# Patient Record
Sex: Female | Born: 1945 | Race: Black or African American | Hispanic: No | Smoking: Never smoker
Health system: Southern US, Community
[De-identification: ages and names within clinical notes are randomized; demographics above are authoritative.]

## PROBLEM LIST (undated history)

## (undated) ENCOUNTER — Emergency Department (HOSPITAL_BASED_OUTPATIENT_CLINIC_OR_DEPARTMENT_OTHER): Admission: EM | Payer: Commercial Managed Care - HMO | Source: Home / Self Care

## (undated) DIAGNOSIS — E119 Type 2 diabetes mellitus without complications: Secondary | ICD-10-CM

## (undated) DIAGNOSIS — I639 Cerebral infarction, unspecified: Secondary | ICD-10-CM

## (undated) DIAGNOSIS — I10 Essential (primary) hypertension: Secondary | ICD-10-CM

---

## 2015-03-02 DIAGNOSIS — H269 Unspecified cataract: Secondary | ICD-10-CM | POA: Insufficient documentation

## 2015-11-25 DIAGNOSIS — D72829 Elevated white blood cell count, unspecified: Secondary | ICD-10-CM | POA: Insufficient documentation

## 2015-11-25 DIAGNOSIS — I10 Essential (primary) hypertension: Secondary | ICD-10-CM | POA: Insufficient documentation

## 2015-11-25 DIAGNOSIS — N179 Acute kidney failure, unspecified: Secondary | ICD-10-CM | POA: Insufficient documentation

## 2015-11-25 DIAGNOSIS — R55 Syncope and collapse: Secondary | ICD-10-CM | POA: Insufficient documentation

## 2016-02-04 DIAGNOSIS — E785 Hyperlipidemia, unspecified: Secondary | ICD-10-CM | POA: Insufficient documentation

## 2016-02-04 DIAGNOSIS — H9203 Otalgia, bilateral: Secondary | ICD-10-CM | POA: Insufficient documentation

## 2016-02-04 DIAGNOSIS — J399 Disease of upper respiratory tract, unspecified: Secondary | ICD-10-CM | POA: Insufficient documentation

## 2016-02-04 DIAGNOSIS — M170 Bilateral primary osteoarthritis of knee: Secondary | ICD-10-CM | POA: Insufficient documentation

## 2016-03-22 DIAGNOSIS — R928 Other abnormal and inconclusive findings on diagnostic imaging of breast: Secondary | ICD-10-CM | POA: Insufficient documentation

## 2016-04-01 DIAGNOSIS — R590 Localized enlarged lymph nodes: Secondary | ICD-10-CM | POA: Insufficient documentation

## 2016-11-20 DIAGNOSIS — Z961 Presence of intraocular lens: Secondary | ICD-10-CM | POA: Insufficient documentation

## 2016-11-20 DIAGNOSIS — H52203 Unspecified astigmatism, bilateral: Secondary | ICD-10-CM | POA: Insufficient documentation

## 2016-11-20 DIAGNOSIS — Z794 Long term (current) use of insulin: Secondary | ICD-10-CM | POA: Insufficient documentation

## 2017-06-08 DIAGNOSIS — E871 Hypo-osmolality and hyponatremia: Secondary | ICD-10-CM | POA: Insufficient documentation

## 2018-09-29 DIAGNOSIS — R519 Headache, unspecified: Secondary | ICD-10-CM | POA: Insufficient documentation

## 2018-09-29 DIAGNOSIS — R42 Dizziness and giddiness: Secondary | ICD-10-CM | POA: Insufficient documentation

## 2020-04-26 DIAGNOSIS — E1169 Type 2 diabetes mellitus with other specified complication: Secondary | ICD-10-CM | POA: Insufficient documentation

## 2020-04-26 DIAGNOSIS — E669 Obesity, unspecified: Secondary | ICD-10-CM | POA: Insufficient documentation

## 2020-04-26 DIAGNOSIS — E876 Hypokalemia: Secondary | ICD-10-CM | POA: Insufficient documentation

## 2020-04-26 DIAGNOSIS — E559 Vitamin D deficiency, unspecified: Secondary | ICD-10-CM | POA: Insufficient documentation

## 2020-05-02 DIAGNOSIS — R809 Proteinuria, unspecified: Secondary | ICD-10-CM | POA: Insufficient documentation

## 2020-05-02 DIAGNOSIS — M109 Gout, unspecified: Secondary | ICD-10-CM | POA: Insufficient documentation

## 2020-05-02 DIAGNOSIS — M1990 Unspecified osteoarthritis, unspecified site: Secondary | ICD-10-CM | POA: Insufficient documentation

## 2021-04-03 ENCOUNTER — Other Ambulatory Visit: Payer: Self-pay

## 2021-04-03 ENCOUNTER — Emergency Department (HOSPITAL_BASED_OUTPATIENT_CLINIC_OR_DEPARTMENT_OTHER)
Admission: EM | Admit: 2021-04-03 | Discharge: 2021-04-03 | Disposition: A | Discharge status: Home / Self Care | Payer: Medicare Other | Attending: Emergency Medicine | Admitting: Emergency Medicine

## 2021-04-03 ENCOUNTER — Emergency Department (HOSPITAL_BASED_OUTPATIENT_CLINIC_OR_DEPARTMENT_OTHER): Payer: Medicare Other

## 2021-04-03 ENCOUNTER — Encounter (HOSPITAL_BASED_OUTPATIENT_CLINIC_OR_DEPARTMENT_OTHER): Payer: Self-pay | Admitting: *Deleted

## 2021-04-03 DIAGNOSIS — U071 COVID-19: Secondary | ICD-10-CM | POA: Diagnosis not present

## 2021-04-03 DIAGNOSIS — I10 Essential (primary) hypertension: Secondary | ICD-10-CM | POA: Insufficient documentation

## 2021-04-03 DIAGNOSIS — Z28310 Unvaccinated for covid-19: Secondary | ICD-10-CM | POA: Diagnosis not present

## 2021-04-03 DIAGNOSIS — R0981 Nasal congestion: Secondary | ICD-10-CM | POA: Diagnosis present

## 2021-04-03 DIAGNOSIS — E119 Type 2 diabetes mellitus without complications: Secondary | ICD-10-CM | POA: Insufficient documentation

## 2021-04-03 HISTORY — DX: Essential (primary) hypertension: I10

## 2021-04-03 HISTORY — DX: Type 2 diabetes mellitus without complications: E11.9

## 2021-04-03 LAB — CBC WITH DIFFERENTIAL/PLATELET
Abs Immature Granulocytes: 0.13 10*3/uL — ABNORMAL HIGH (ref 0.00–0.07)
Basophils Absolute: 0 10*3/uL (ref 0.0–0.1)
Basophils Relative: 0 %
Eosinophils Absolute: 0 10*3/uL (ref 0.0–0.5)
Eosinophils Relative: 0 %
HCT: 37.8 % (ref 36.0–46.0)
Hemoglobin: 12.8 g/dL (ref 12.0–15.0)
Immature Granulocytes: 1 %
Lymphocytes Relative: 18 %
Lymphs Abs: 2.2 10*3/uL (ref 0.7–4.0)
MCH: 27.5 pg (ref 26.0–34.0)
MCHC: 33.9 g/dL (ref 30.0–36.0)
MCV: 81.3 fL (ref 80.0–100.0)
Monocytes Absolute: 2.1 10*3/uL — ABNORMAL HIGH (ref 0.1–1.0)
Monocytes Relative: 17 %
Neutro Abs: 7.9 10*3/uL — ABNORMAL HIGH (ref 1.7–7.7)
Neutrophils Relative %: 64 %
Platelets: 169 10*3/uL (ref 150–400)
RBC: 4.65 MIL/uL (ref 3.87–5.11)
RDW: 15.6 % — ABNORMAL HIGH (ref 11.5–15.5)
WBC: 12.6 10*3/uL — ABNORMAL HIGH (ref 4.0–10.5)
nRBC: 0 % (ref 0.0–0.2)

## 2021-04-03 LAB — BASIC METABOLIC PANEL
Anion gap: 11 (ref 5–15)
BUN: 21 mg/dL (ref 8–23)
CO2: 22 mmol/L (ref 22–32)
Calcium: 8 mg/dL — ABNORMAL LOW (ref 8.9–10.3)
Chloride: 97 mmol/L — ABNORMAL LOW (ref 98–111)
Creatinine, Ser: 1.67 mg/dL — ABNORMAL HIGH (ref 0.44–1.00)
GFR, Estimated: 32 mL/min — ABNORMAL LOW (ref >60)
Glucose, Bld: 189 mg/dL — ABNORMAL HIGH (ref 70–99)
Potassium: 3.4 mmol/L — ABNORMAL LOW (ref 3.5–5.1)
Sodium: 130 mmol/L — ABNORMAL LOW (ref 135–145)

## 2021-04-03 LAB — RESP PANEL BY RT-PCR (FLU A&B, COVID) ARPGX2
Influenza A by PCR: NEGATIVE
Influenza B by PCR: NEGATIVE
SARS Coronavirus 2 by RT PCR: POSITIVE — AB

## 2021-04-03 IMAGING — DX DG CHEST 1V PORT
1 series · 1 of 1 positions shown · non-contrast
Comparison: [DATE]

CLINICAL DATA: Cough and nasal congestion.

EXAM:
PORTABLE CHEST 1 VIEW

[chest ap]
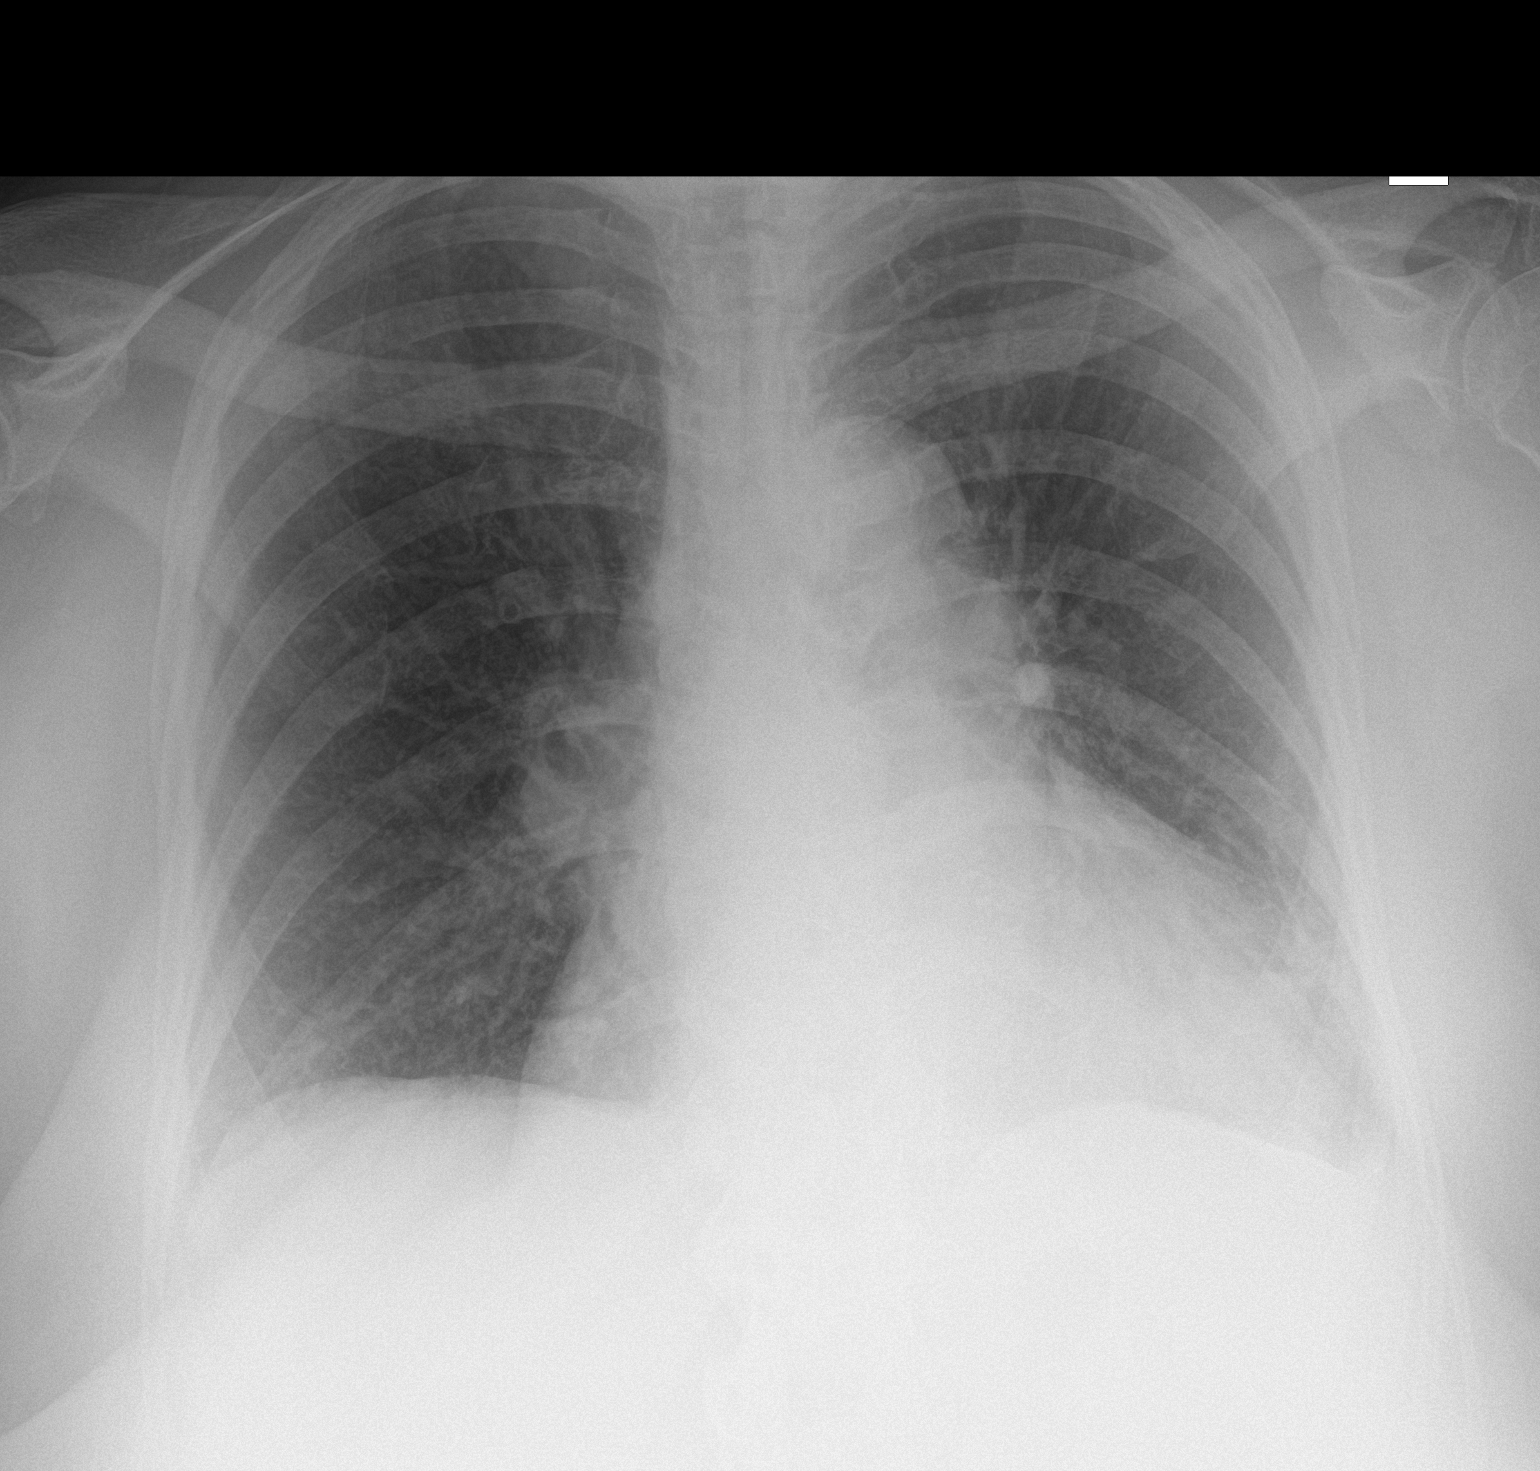

[1 of 1 positions shown; findings below may reference images not displayed]

FINDINGS: The patient is rotated to the right on today's radiograph, reducing
diagnostic sensitivity and specificity. Atherosclerotic
calcification of the aortic arch. Mild to moderate enlargement of
the cardiopericardial silhouette, without edema. Airway thickening
is present, suggesting bronchitis or reactive airways disease.
Chronically stable lingular scarring.
IMPRESSION: 1. Essentially stable enlargement of the cardiopericardial
silhouette.
2. Chronic lingular scarring.
3. Airway thickening is present, suggesting bronchitis or reactive
airways disease.
4.  Aortic Atherosclerosis ([SO]-[SO]).

## 2021-04-03 MED ORDER — MOLNUPIRAVIR EUA 200MG CAPSULE: 4.0000 | ORAL_CAPSULE | Freq: Two times a day (BID) | ORAL | 0 refills | Status: AC

## 2021-04-03 MED ORDER — ACETAMINOPHEN 325 MG PO TABS
650.0000 mg | ORAL_TABLET | Freq: Once | ORAL | Status: AC
  Administered 2021-04-03: 650 mg via ORAL
  Filled 2021-04-03: qty 2

## 2021-04-03 MED ORDER — MOLNUPIRAVIR EUA 200MG CAPSULE: 4.0000 | ORAL_CAPSULE | Freq: Two times a day (BID) | ORAL | 0 refills | Status: DC

## 2021-04-03 NOTE — Discharge Instructions (Addendum)
You have been seen here for URI like symptoms.  I recommend taking Tylenol for fever and pain control please follow dosing on the back of bottle.  I recommend staying hydrated and if you do not an appetite, I recommend soups as this will provide you with fluids and calories.    you are Covid positive you must self quarantine for 5 days starting on symptom onset, if at the end of those 5 days you are feeling better you may return back to school/work, if you continue to have symptoms you must self quarantine for additional 5 days.  I would like you to contact "post Covid care" as they will provide you with information how to manage your Covid symptoms   Please follow-up with your PCP about your potassium, and kidney function in 1 week's time.  Please drink plenty fluids.  Avoid ibuprofen, Goody powders, and NSAIDs, as this can worsen your kidney functions.  Come back to the emergency department if you develop chest pain, shortness of breath, severe abdominal pain, uncontrolled nausea, vomiting, diarrhea.

## 2021-04-03 NOTE — ED Triage Notes (Signed)
Pt reports sinus congestion and tightness since yesterday. Denies any fevers, cough, or other c/o.

## 2021-04-03 NOTE — ED Provider Notes (Signed)
[facility] EMERGENCY DEPARTMENT Provider Note   CSN: [id number] Arrival date & time: 04/03/21  1017     History Chief Complaint  Patient presents with   Nasal Congestion    Tamara Herring is a 75 y.o. female.  HPI  Patient with significant medical history of diabetes, hypertension, renal disease presents  with chief complaint of URI-like symptoms.  Patient states symptoms started yesterday, she endorses chills, nasal ingestion, sore throat, productive cough, general body aches.  She denies chest pain, shortness of breath, abdominal pain, nausea, vomit, diarrhea, is tolerating p.o. without any difficulty.  She denies recent sick contacts, is not vaccine against COVID or influenza, she denies  alleviating factors.  Patient has no other complaints at this time.  Past Medical History:  Diagnosis Date   Diabetes mellitus without complication (HCC)    Hypertension     There are no problems to display for this patient.   History reviewed. No pertinent surgical history.   OB History   No obstetric history on file.     History reviewed. No pertinent family history.  Social History   Tobacco Use   Smoking status: Never   Smokeless tobacco: Never    Home Medications Prior to Admission medications   Medication Sig Start Date End Date Taking? Authorizing Provider  molnupiravir EUA 200 mg CAPS Take 4 capsules (800 mg total) by mouth 2 (two) times daily for 5 days. 04/03/21 04/08/21  Carroll Sage, PA-C    Allergies    Codeine  Review of Systems   Review of Systems  Constitutional:  Positive for chills. Negative for fever.  HENT:  Positive for congestion and sore throat. Negative for trouble swallowing and voice change.   Respiratory:  Positive for cough. Negative for shortness of breath.   Cardiovascular:  Negative for chest pain.  Gastrointestinal:  Negative for abdominal pain, diarrhea, nausea and vomiting.  Genitourinary:  Negative for enuresis.   Musculoskeletal:  Negative for myalgias.  Skin:  Negative for rash.  Neurological:  Negative for headaches.  Hematological:  Does not bruise/bleed easily.   Physical Exam Updated Vital Signs BP (!) 141/77   Pulse 80   Temp 99 F (37.2 C) (Oral)   Resp 18   Ht 5\' 6"  (1.676 m)   Wt 98 kg   SpO2 96%   BMI 34.86 kg/m   Physical Exam Vitals and nursing note reviewed.  Constitutional:      General: She is not in acute distress.    Appearance: She is not ill-appearing.  HENT:     Head: Normocephalic and atraumatic.     Right Ear: Tympanic membrane, ear canal and external ear normal.     Left Ear: Tympanic membrane, ear canal and external ear normal.     Nose: Congestion present.     Comments: Erythematous turbinates     Mouth/Throat:     Mouth: Mucous membranes are moist.     Pharynx: Oropharynx is clear. No oropharyngeal exudate or posterior oropharyngeal erythema.  Eyes:     Conjunctiva/sclera: Conjunctivae normal.  Cardiovascular:     Rate and Rhythm: Normal rate and regular rhythm.     Pulses: Normal pulses.     Heart sounds: No murmur heard.   No friction rub. No gallop.  Pulmonary:     Effort: No respiratory distress.     Breath sounds: No wheezing, rhonchi or rales.  Abdominal:     Palpations: Abdomen is soft.     Tenderness: There  is no abdominal tenderness. There is no right CVA tenderness or left CVA tenderness.  Musculoskeletal:     Right lower leg: No edema.     Left lower leg: No edema.  Skin:    General: Skin is warm and dry.  Neurological:     Mental Status: She is alert.  Psychiatric:        Mood and Affect: Mood normal.    ED Results / Procedures / Treatments   Labs (all labs ordered are listed, but only abnormal results are displayed) Labs Reviewed  RESP PANEL BY RT-PCR (FLU A&B, COVID) ARPGX2 - Abnormal; Notable for the following components:      Result Value   SARS Coronavirus 2 by RT PCR POSITIVE (*)    All other components within normal  limits  BASIC METABOLIC PANEL - Abnormal; Notable for the following components:   Sodium 130 (*)    Potassium 3.4 (*)    Chloride 97 (*)    Glucose, Bld 189 (*)    Creatinine, Ser 1.67 (*)    Calcium 8.0 (*)    GFR, Estimated 32 (*)    All other components within normal limits  CBC WITH DIFFERENTIAL/PLATELET - Abnormal; Notable for the following components:   WBC 12.6 (*)    RDW 15.6 (*)    Neutro Abs 7.9 (*)    Monocytes Absolute 2.1 (*)    Abs Immature Granulocytes 0.13 (*)    All other components within normal limits    EKG None  Radiology DG Chest Port 1 View  Result Date: 04/03/2021 CLINICAL DATA:  Cough and nasal congestion. EXAM: PORTABLE CHEST 1 VIEW COMPARISON:  09/06/2018 FINDINGS: The patient is rotated to the right on today's radiograph, reducing diagnostic sensitivity and specificity. Atherosclerotic calcification of the aortic arch. Mild to moderate enlargement of the cardiopericardial silhouette, without edema. Airway thickening is present, suggesting bronchitis or reactive airways disease. Chronically stable lingular scarring. IMPRESSION: 1. Essentially stable enlargement of the cardiopericardial silhouette. 2. Chronic lingular scarring. 3. Airway thickening is present, suggesting bronchitis or reactive airways disease. 4.  Aortic Atherosclerosis (ICD10-I70.0). Electronically Signed   By: Gaylyn Rong M.D.   On: 04/03/2021 12:01    Procedures Procedures   Medications Ordered in ED Medications  acetaminophen (TYLENOL) tablet 650 mg (650 mg Oral Given 04/03/21 1146)    ED Course  I have reviewed the triage vital signs and the nursing notes.  Pertinent labs & imaging results that were available during my care of the patient were reviewed by me and considered in my medical decision making (see chart for details).    MDM Rules/Calculators/A&P                         Initial impression-patient presents with URI-like symptoms.  She is alert, does not appear in  acute distress, vital signs reassuring.  Suspect patient suffering from a viral infection.  Will obtain basic lab work-up, chest x-ray provide patient with Tylenol and reassess.  Work-up-CBC shows slight leukocytosis of 12.6, BMP shows slight hyponatremia 130, hypokalemia 3.4, hyperglycemia 189, creatinine 1.67, GFR 32, COVID positive.  Chest x-ray reveals airway thickening suggestive of bronchitis or reactive airway disease.  Rule out- Low suspicion for systemic infection as patient is nontoxic-appearing, vital signs reassuring.  Patient does have slight fever and leukocytosis but I suspect this is secondary due to a viral infection, no signs of consolidation seen on chest x-ray will defer antibiotic treatment at this  time.  Low suspicion for pneumonia as lung sounds are clear bilaterally, x-ray did not reveal any acute findings.  I have low suspicion for PE as patient denies pleuritic chest pain, shortness of breath, patient is not tachypneic, maintaining 95 and above on room air. low suspicion for strep throat as oropharynx was visualized, no erythema or exudates noted.  Low suspicion patient would need  hospitalized due to viral infection or Covid as vital signs reassuring, patient is not in respiratory distress.  The patient has a low GFR this appears to be a baseline for patient, she is currently following with nephrology.  We will have her avoid all nephrotoxic medications at this time.   Plan-  COVID infection-suspect this is because of patient's symptoms, due to patient's low GFR will defer Paxlovid and provide patient with other antiviral.  We will have her follow-up with post COVID care, for further evaluation.  Vital signs have remained stable, no indication for hospital admission.  Patient discussed with attending and they agreed with assessment and plan.  Patient given at home care as well strict return precautions.  Patient verbalized that they understood agreed to said plan.  Final  Clinical Impression(s) / ED Diagnoses Final diagnoses:  COVID    Rx / DC Orders ED Discharge Orders          Ordered    molnupiravir EUA 200 mg CAPS  2 times daily,   Status:  Discontinued        04/03/21 1337    molnupiravir EUA 200 mg CAPS  2 times daily        04/03/21 1340             Barnie Del 04/03/21 1343    Alvira Monday, MD 04/04/21 [phone]

## 2021-10-02 ENCOUNTER — Emergency Department (HOSPITAL_COMMUNITY): Payer: Medicare Other

## 2021-10-02 ENCOUNTER — Inpatient Hospital Stay (HOSPITAL_COMMUNITY)
Admission: EM | Admit: 2021-10-02 | Discharge: 2021-10-06 | DRG: 065 | Disposition: A | Discharge status: Home Health | Payer: Medicare Other | Attending: Family Medicine | Admitting: Family Medicine

## 2021-10-02 ENCOUNTER — Encounter (HOSPITAL_COMMUNITY): Payer: Self-pay | Admitting: Emergency Medicine

## 2021-10-02 ENCOUNTER — Other Ambulatory Visit: Payer: Self-pay

## 2021-10-02 DIAGNOSIS — I63512 Cerebral infarction due to unspecified occlusion or stenosis of left middle cerebral artery: Secondary | ICD-10-CM | POA: Diagnosis not present

## 2021-10-02 DIAGNOSIS — R531 Weakness: Secondary | ICD-10-CM | POA: Diagnosis not present

## 2021-10-02 DIAGNOSIS — R29703 NIHSS score 3: Secondary | ICD-10-CM | POA: Diagnosis not present

## 2021-10-02 DIAGNOSIS — R29706 NIHSS score 6: Secondary | ICD-10-CM | POA: Diagnosis not present

## 2021-10-02 DIAGNOSIS — Z7982 Long term (current) use of aspirin: Secondary | ICD-10-CM

## 2021-10-02 DIAGNOSIS — R471 Dysarthria and anarthria: Secondary | ICD-10-CM | POA: Diagnosis present

## 2021-10-02 DIAGNOSIS — Z6834 Body mass index (BMI) 34.0-34.9, adult: Secondary | ICD-10-CM

## 2021-10-02 DIAGNOSIS — D509 Iron deficiency anemia, unspecified: Secondary | ICD-10-CM | POA: Diagnosis present

## 2021-10-02 DIAGNOSIS — E1122 Type 2 diabetes mellitus with diabetic chronic kidney disease: Secondary | ICD-10-CM | POA: Diagnosis present

## 2021-10-02 DIAGNOSIS — N1832 Chronic kidney disease, stage 3b: Secondary | ICD-10-CM | POA: Diagnosis present

## 2021-10-02 DIAGNOSIS — K148 Other diseases of tongue: Secondary | ICD-10-CM | POA: Diagnosis present

## 2021-10-02 DIAGNOSIS — R9082 White matter disease, unspecified: Secondary | ICD-10-CM | POA: Diagnosis present

## 2021-10-02 DIAGNOSIS — G4730 Sleep apnea, unspecified: Secondary | ICD-10-CM | POA: Diagnosis present

## 2021-10-02 DIAGNOSIS — R29810 Facial weakness: Secondary | ICD-10-CM | POA: Diagnosis not present

## 2021-10-02 DIAGNOSIS — Z20822 Contact with and (suspected) exposure to covid-19: Secondary | ICD-10-CM | POA: Diagnosis present

## 2021-10-02 DIAGNOSIS — I2510 Atherosclerotic heart disease of native coronary artery without angina pectoris: Secondary | ICD-10-CM | POA: Diagnosis present

## 2021-10-02 DIAGNOSIS — Z79899 Other long term (current) drug therapy: Secondary | ICD-10-CM

## 2021-10-02 DIAGNOSIS — E785 Hyperlipidemia, unspecified: Secondary | ICD-10-CM | POA: Diagnosis present

## 2021-10-02 DIAGNOSIS — E669 Obesity, unspecified: Secondary | ICD-10-CM | POA: Diagnosis present

## 2021-10-02 DIAGNOSIS — Z91018 Allergy to other foods: Secondary | ICD-10-CM

## 2021-10-02 DIAGNOSIS — R29707 NIHSS score 7: Secondary | ICD-10-CM | POA: Diagnosis not present

## 2021-10-02 DIAGNOSIS — R350 Frequency of micturition: Secondary | ICD-10-CM | POA: Diagnosis present

## 2021-10-02 DIAGNOSIS — R7989 Other specified abnormal findings of blood chemistry: Secondary | ICD-10-CM | POA: Diagnosis present

## 2021-10-02 DIAGNOSIS — R29704 NIHSS score 4: Secondary | ICD-10-CM | POA: Diagnosis present

## 2021-10-02 DIAGNOSIS — I6330 Cerebral infarction due to thrombosis of unspecified cerebral artery: Secondary | ICD-10-CM | POA: Insufficient documentation

## 2021-10-02 DIAGNOSIS — I252 Old myocardial infarction: Secondary | ICD-10-CM

## 2021-10-02 DIAGNOSIS — K219 Gastro-esophageal reflux disease without esophagitis: Secondary | ICD-10-CM | POA: Diagnosis present

## 2021-10-02 DIAGNOSIS — Z794 Long term (current) use of insulin: Secondary | ICD-10-CM

## 2021-10-02 DIAGNOSIS — R29705 NIHSS score 5: Secondary | ICD-10-CM | POA: Diagnosis not present

## 2021-10-02 DIAGNOSIS — E1165 Type 2 diabetes mellitus with hyperglycemia: Secondary | ICD-10-CM | POA: Diagnosis present

## 2021-10-02 DIAGNOSIS — G8191 Hemiplegia, unspecified affecting right dominant side: Secondary | ICD-10-CM | POA: Diagnosis present

## 2021-10-02 DIAGNOSIS — Z888 Allergy status to other drugs, medicaments and biological substances status: Secondary | ICD-10-CM

## 2021-10-02 DIAGNOSIS — I639 Cerebral infarction, unspecified: Secondary | ICD-10-CM

## 2021-10-02 DIAGNOSIS — Z91013 Allergy to seafood: Secondary | ICD-10-CM

## 2021-10-02 DIAGNOSIS — I129 Hypertensive chronic kidney disease with stage 1 through stage 4 chronic kidney disease, or unspecified chronic kidney disease: Secondary | ICD-10-CM | POA: Diagnosis present

## 2021-10-02 DIAGNOSIS — Z885 Allergy status to narcotic agent status: Secondary | ICD-10-CM

## 2021-10-02 DIAGNOSIS — Z66 Do not resuscitate: Secondary | ICD-10-CM | POA: Diagnosis present

## 2021-10-02 LAB — COMPREHENSIVE METABOLIC PANEL
ALT: 15 U/L (ref 0–44)
AST: 20 U/L (ref 15–41)
Albumin: 3.7 g/dL (ref 3.5–5.0)
Alkaline Phosphatase: 77 U/L (ref 38–126)
Anion gap: 11 (ref 5–15)
BUN: 16 mg/dL (ref 8–23)
CO2: 21 mmol/L — ABNORMAL LOW (ref 22–32)
Calcium: 8.8 mg/dL — ABNORMAL LOW (ref 8.9–10.3)
Chloride: 103 mmol/L (ref 98–111)
Creatinine, Ser: 1.56 mg/dL — ABNORMAL HIGH (ref 0.44–1.00)
GFR, Estimated: 34 mL/min — ABNORMAL LOW (ref >60)
Glucose, Bld: 137 mg/dL — ABNORMAL HIGH (ref 70–99)
Potassium: 4 mmol/L (ref 3.5–5.1)
Sodium: 135 mmol/L (ref 135–145)
Total Bilirubin: 0.8 mg/dL (ref 0.3–1.2)
Total Protein: 7.6 g/dL (ref 6.5–8.1)

## 2021-10-02 LAB — CBG MONITORING, ED
Glucose-Capillary: 110 mg/dL — ABNORMAL HIGH (ref 70–99)
Glucose-Capillary: 116 mg/dL — ABNORMAL HIGH (ref 70–99)
Glucose-Capillary: 137 mg/dL — ABNORMAL HIGH (ref 70–99)

## 2021-10-02 LAB — DIFFERENTIAL
Abs Immature Granulocytes: 0.05 10*3/uL (ref 0.00–0.07)
Basophils Absolute: 0 10*3/uL (ref 0.0–0.1)
Basophils Relative: 0 %
Eosinophils Absolute: 0.1 10*3/uL (ref 0.0–0.5)
Eosinophils Relative: 2 %
Immature Granulocytes: 1 %
Lymphocytes Relative: 31 %
Lymphs Abs: 2.4 10*3/uL (ref 0.7–4.0)
Monocytes Absolute: 0.7 10*3/uL (ref 0.1–1.0)
Monocytes Relative: 10 %
Neutro Abs: 4.3 10*3/uL (ref 1.7–7.7)
Neutrophils Relative %: 56 %

## 2021-10-02 LAB — CBC
HCT: 36.8 % (ref 36.0–46.0)
Hemoglobin: 11.8 g/dL — ABNORMAL LOW (ref 12.0–15.0)
MCH: 26.5 pg (ref 26.0–34.0)
MCHC: 32.1 g/dL (ref 30.0–36.0)
MCV: 82.7 fL (ref 80.0–100.0)
Platelets: 251 10*3/uL (ref 150–400)
RBC: 4.45 MIL/uL (ref 3.87–5.11)
RDW: 15.9 % — ABNORMAL HIGH (ref 11.5–15.5)
WBC: 7.6 10*3/uL (ref 4.0–10.5)
nRBC: 0 % (ref 0.0–0.2)

## 2021-10-02 LAB — I-STAT CHEM 8, ED
BUN: 17 mg/dL (ref 8–23)
Calcium, Ion: 1.11 mmol/L — ABNORMAL LOW (ref 1.15–1.40)
Chloride: 105 mmol/L (ref 98–111)
Creatinine, Ser: 1.5 mg/dL — ABNORMAL HIGH (ref 0.44–1.00)
Glucose, Bld: 132 mg/dL — ABNORMAL HIGH (ref 70–99)
HCT: 38 % (ref 36.0–46.0)
Hemoglobin: 12.9 g/dL (ref 12.0–15.0)
Potassium: 4 mmol/L (ref 3.5–5.1)
Sodium: 138 mmol/L (ref 135–145)
TCO2: 22 mmol/L (ref 22–32)

## 2021-10-02 LAB — LIPID PANEL
Cholesterol: 168 mg/dL (ref 0–200)
HDL: 53 mg/dL (ref >40)
LDL Cholesterol: 97 mg/dL (ref 0–99)
Total CHOL/HDL Ratio: 3.2 RATIO
Triglycerides: 89 mg/dL (ref <150)
VLDL: 18 mg/dL (ref 0–40)

## 2021-10-02 LAB — HEMOGLOBIN A1C
Hgb A1c MFr Bld: 7.2 % — ABNORMAL HIGH (ref 4.8–5.6)
Mean Plasma Glucose: 159.94 mg/dL

## 2021-10-02 LAB — PROTIME-INR
INR: 1.1 (ref 0.8–1.2)
Prothrombin Time: 13.7 seconds (ref 11.4–15.2)

## 2021-10-02 LAB — APTT: aPTT: 34 seconds (ref 24–36)

## 2021-10-02 LAB — TSH: TSH: 1.466 u[IU]/mL (ref 0.350–4.500)

## 2021-10-02 IMAGING — CT CT HEAD W/O CM
4 series · 16 of 47 positions shown, 18 images · non-contrast
Comparison: Head MRI [DATE]

CLINICAL DATA: Neuro deficit, acute, stroke suspected. Right facial
droop, dysarthria, and right facial, arm, and leg numbness.

EXAM:
CT HEAD WITHOUT CONTRAST
TECHNIQUE: Contiguous axial images were obtained from the base of the skull
through the vertex without intravenous contrast.

[Series 3: head without · axial · non-contrast · 0.42mm/px · z∈[-106,+14]mm · 7 of 33 slices shown, 9 images]
[im 5/33  brain]
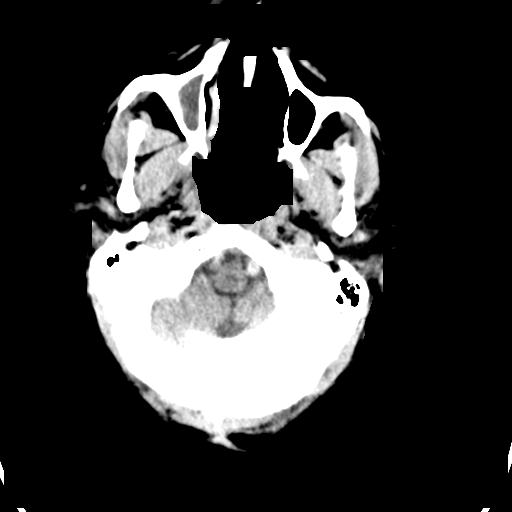
[im 5/33  bone]
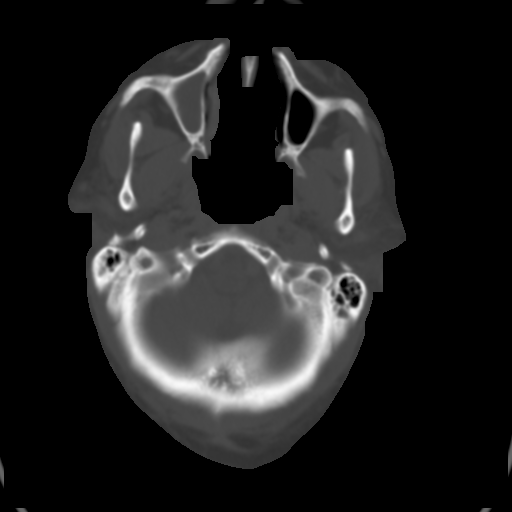
[im 9/33  brain]
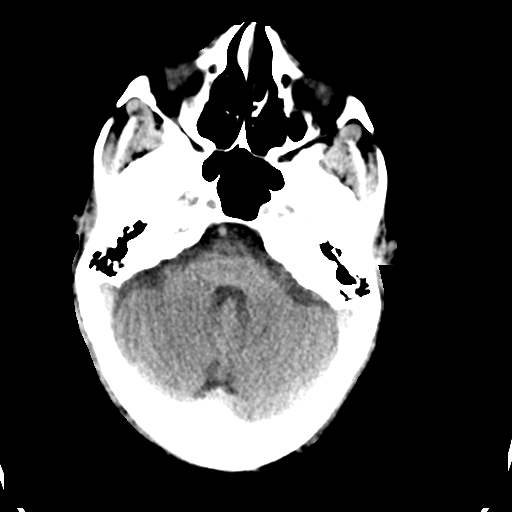
[im 13/33  brain]
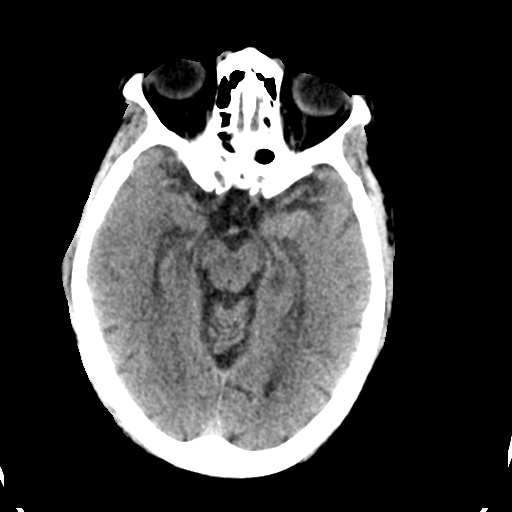
[im 17/33  brain]
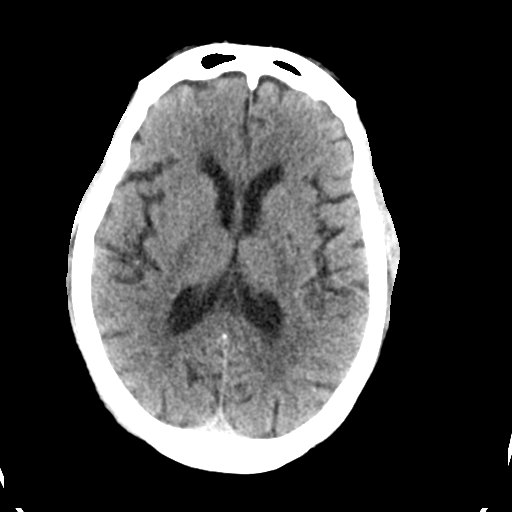
[im 21/33  brain]
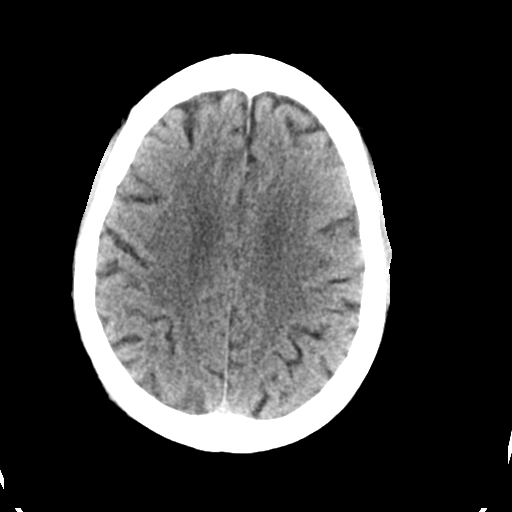
[im 21/33  bone]
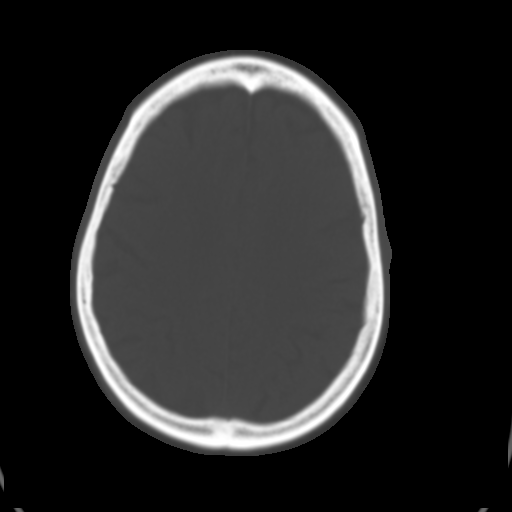
[im 25/33  brain]
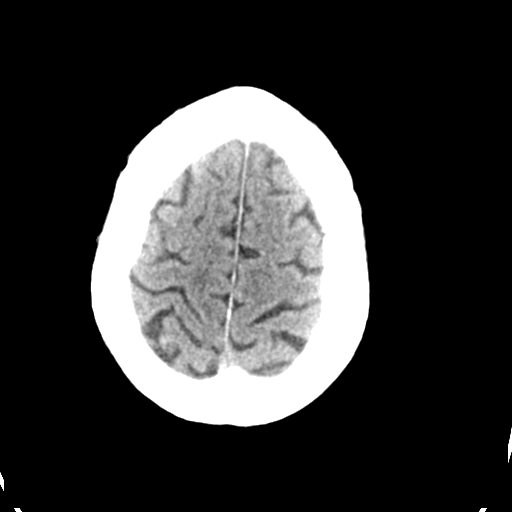
[im 29/33  brain]
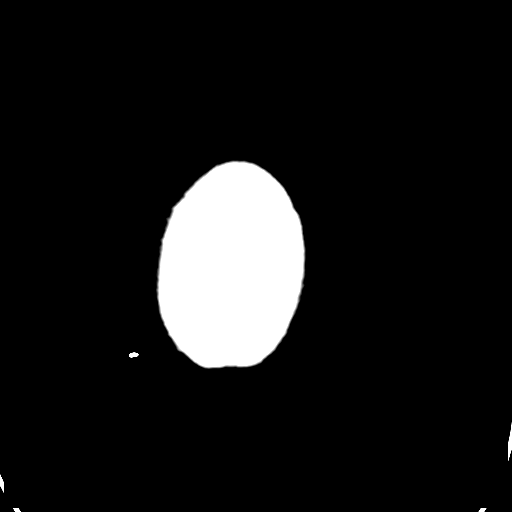

[Series 4: head bone · axial · 0.42mm/px · z∈[-110,-78]mm · 3 of 81 slices shown]
[im 9/81  bone]
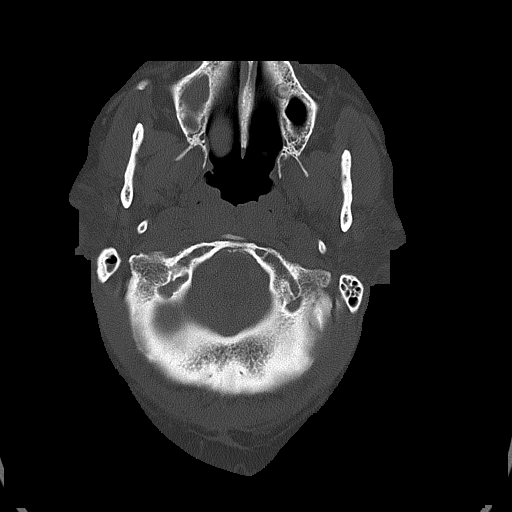
[im 17/81  bone]
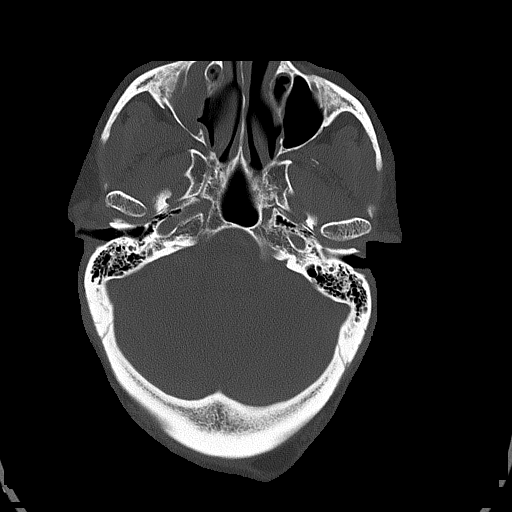
[im 25/81  bone]
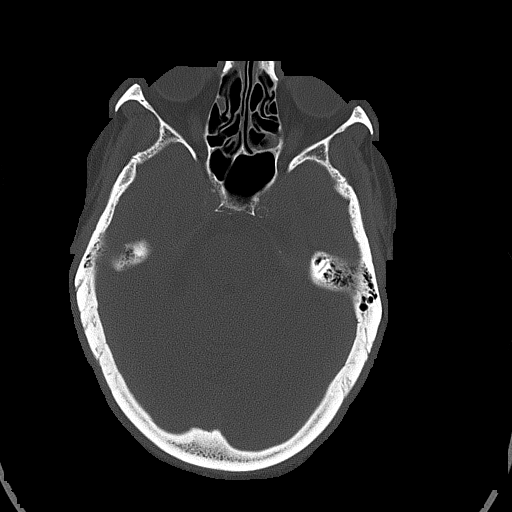

[Series 5: head without cor · coronal · non-contrast · 0.31mm/px · 3 of 68 slices shown]
[im 24/68  brain]
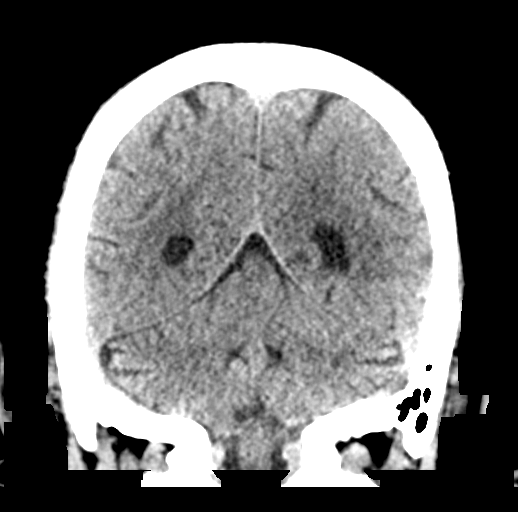
[im 31/68  brain]
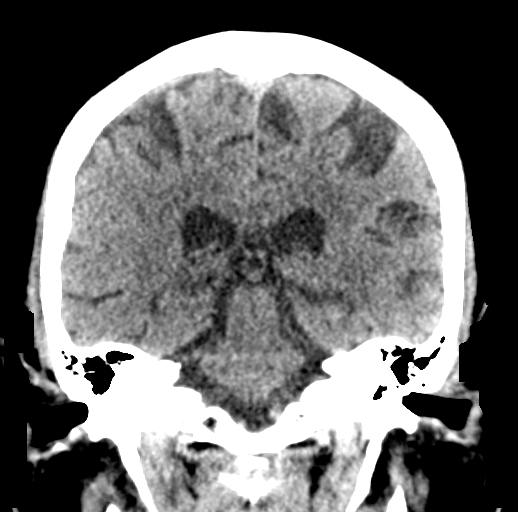
[im 38/68  brain]
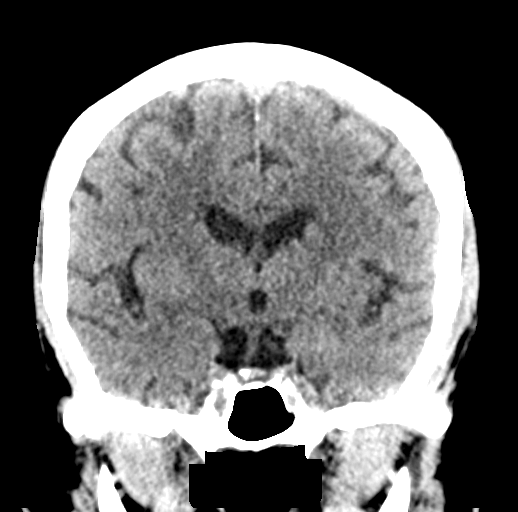

[Series 6: head without sag · sagittal · non-contrast · 0.31mm/px · 3 of 57 slices shown]
[im 19/57  brain]
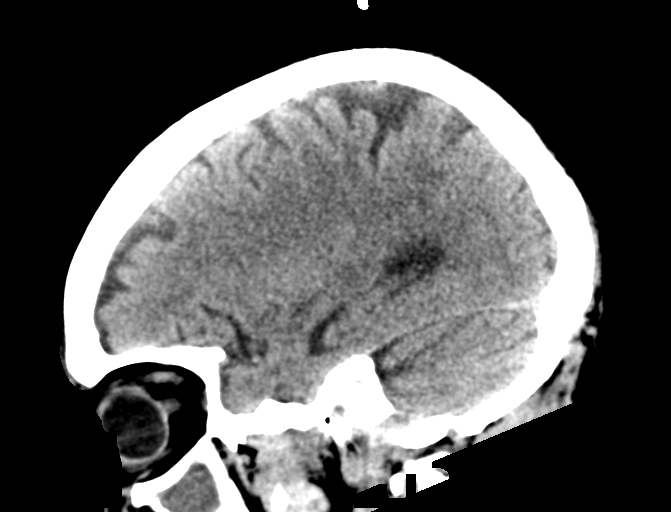
[im 29/57  brain]
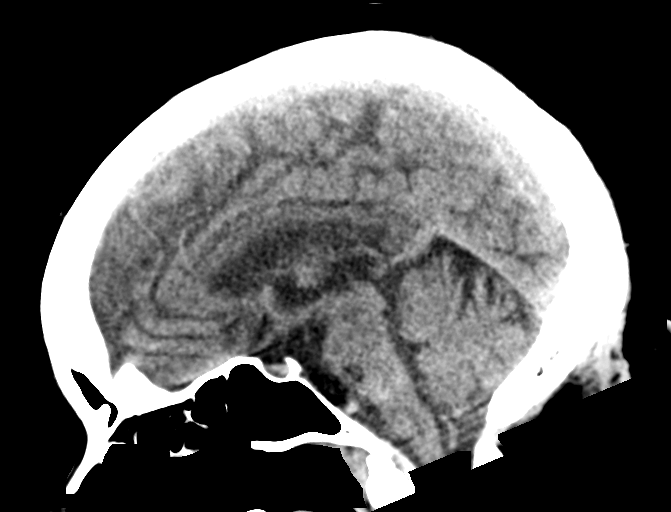
[im 38/57  brain]
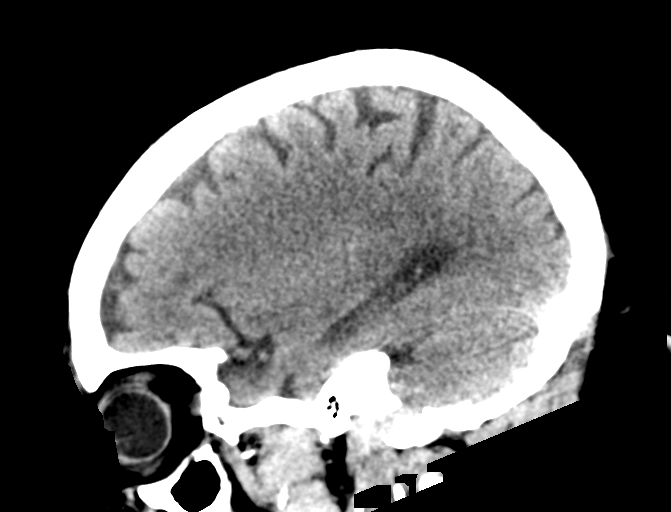

[16 of 47 positions shown; findings below may reference images not displayed]

FINDINGS: Brain: There is no evidence of an acute infarct, intracranial
hemorrhage, mass, midline shift, or extra-axial fluid collection.
The ventricles and sulci are within normal limits for age.
Hypodensities in the cerebral white matter bilaterally are
nonspecific but compatible with mild chronic small vessel ischemic
disease.

Vascular: Calcified atherosclerosis at the skull base. No hyperdense
vessel.

Skull: No fracture or suspicious osseous lesion.

Sinuses/Orbits: Unchanged complete opacification of the right
maxillary sinus. Trace right mastoid fluid. Bilateral cataract
extraction.

Other: None.
IMPRESSION: 1. No evidence of acute intracranial abnormality.
2. Mild chronic small vessel ischemic disease.

## 2021-10-02 IMAGING — MR MR HEAD WO/W CM
7 of 13 series · 21 of 48 positions shown · IV contrast (gadavist)
Comparison: CT head without contrast [DATE]. MR head without
contrast [DATE]

CLINICAL DATA: Neuro deficit, acute, stroke suspected. Right facial
droop. Right upper lower extremity numbness. Dysarthria.

EXAM:
MRI HEAD WITHOUT AND WITH CONTRAST
TECHNIQUE: Multiplanar, multiecho pulse sequences of the brain and surrounding
structures were obtained without and with intravenous contrast.
CONTRAST:  9.5mL GADAVIST GADOBUTROL 1 MMOL/ML IV SOLN

[Series 3: DWI · axial · 3.0mm · 0.94mm/px · z∈[-79,+67]mm · 6 of 100 slices shown (1 of 2)]
[im 1/100]
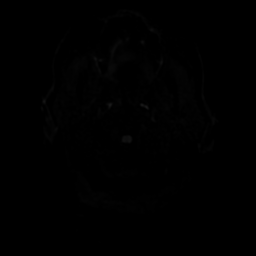
[im 20/100]
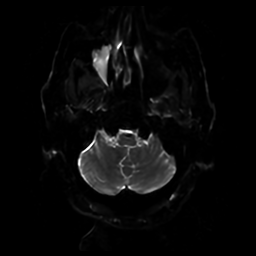
[im 40/100]
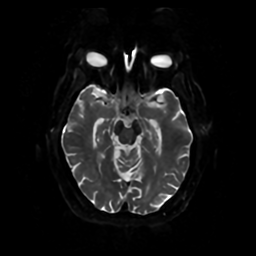
[im 60/100]
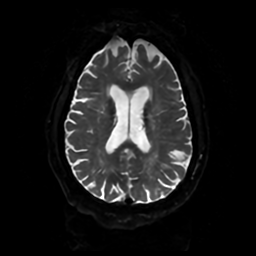
[im 80/100]
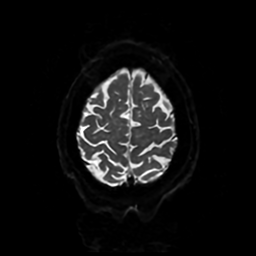
[im 100/100]
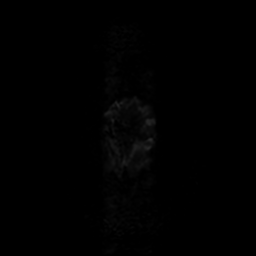

[Series 4: DWI · coronal · 4.0mm · 0.94mm/px · 5 of 74 slices shown (2 of 2)]
[im 1/74]
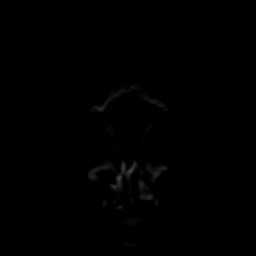
[im 19/74]
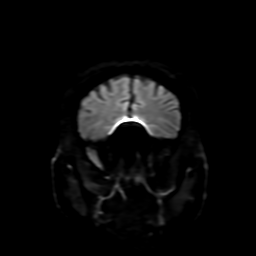
[im 37/74]
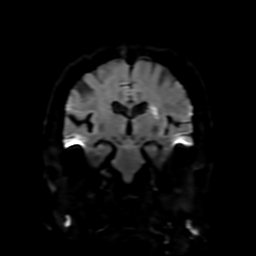
[im 55/74]
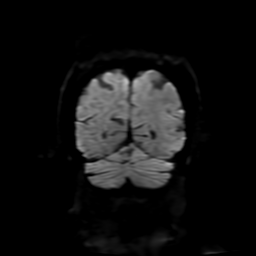
[im 74/74]
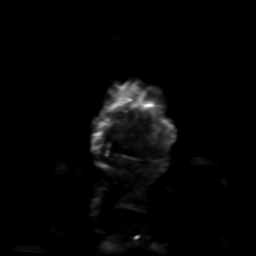

[Series 5: FLAIR · sagittal · 5.0mm · 0.23mm/px · 2 of 27 slices shown (1 of 2)]
[im 1/27]
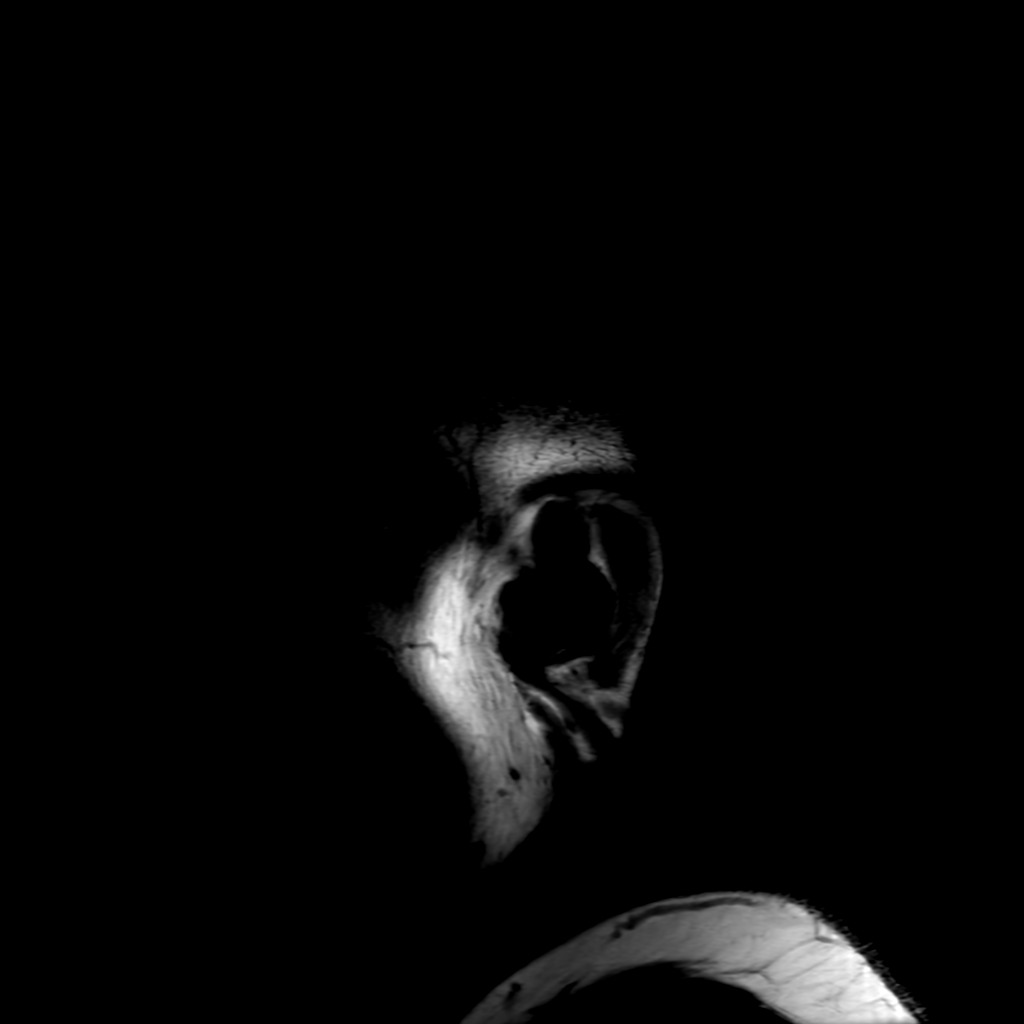
[im 27/27]
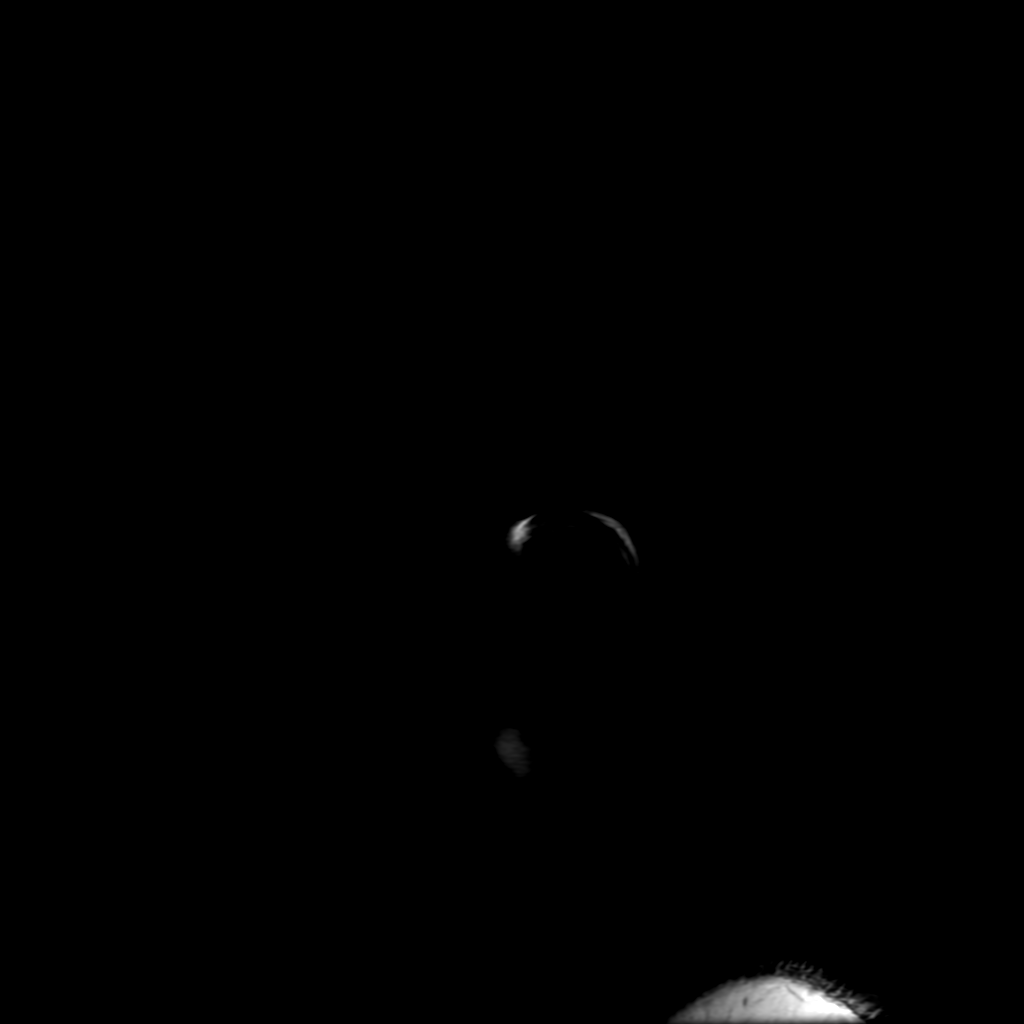

[Series 6: T2 · axial · 5.0mm · 0.23mm/px · 1 of 26 slices shown]
[im 1/26]
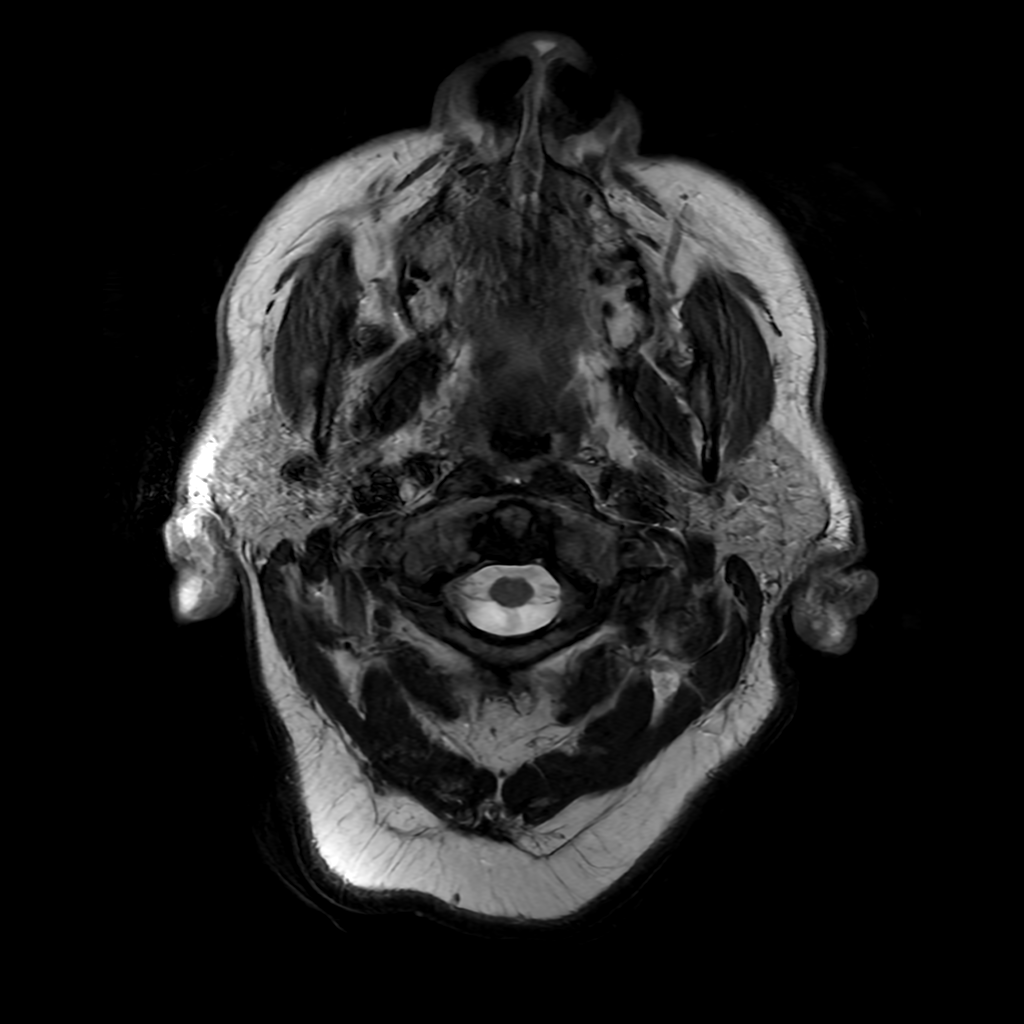

[Series 7: FLAIR · axial · 4.0mm · 0.45mm/px · z∈[-75,+74]mm · 2 of 35 slices shown (2 of 2)]
[im 1/35]
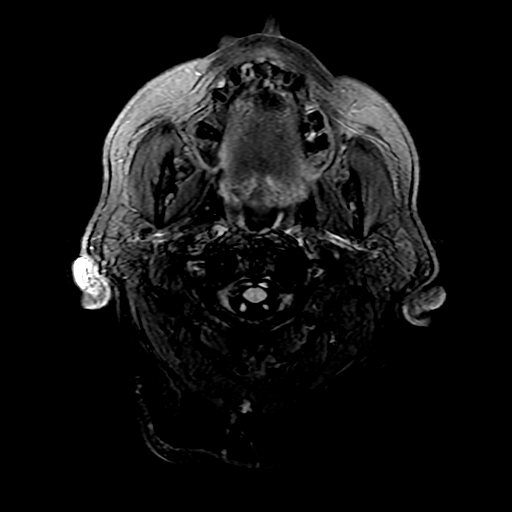
[im 35/35]
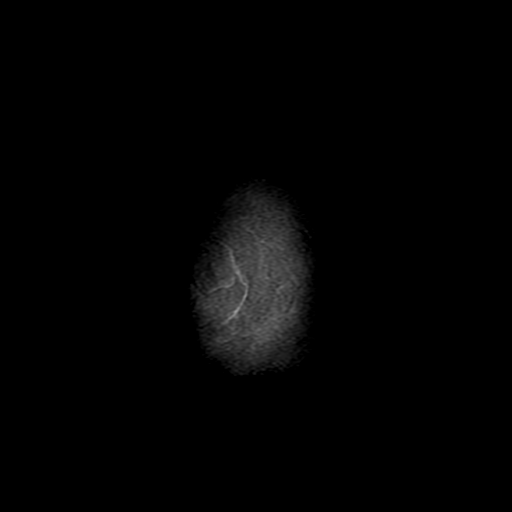

[Series 350: ADC · axial · 3.0mm · 0.94mm/px · z∈[-79,+67]mm · 3 of 50 slices shown (1 of 2)]
[im 1/50]
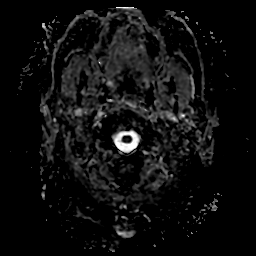
[im 25/50]
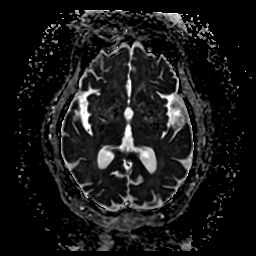
[im 50/50]
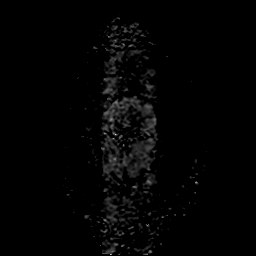

[Series 450: ADC · coronal · 4.0mm · 0.94mm/px · 2 of 35 slices shown (2 of 2)]
[im 1/35]
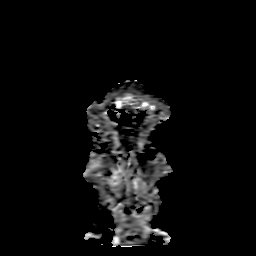
[im 35/35]
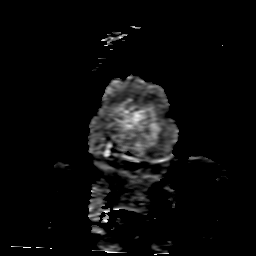

[21 of 48 positions shown; findings below may reference images not displayed]

FINDINGS: Brain: Acute nonhemorrhagic infarct extends from the left lentiform
nucleus into the corona radiata measuring 13 x 11 mm.

Mild generalized atrophy has progressed. Moderate periventricular
and subcortical white matter T2 hyperintensities have progressed as
well.

No acute hemorrhage or mass lesion is present. The internal auditory
canals are within normal limits. A The brainstem and cerebellum are
within normal limits.

Vascular: Flow is present in the major intracranial arteries.

Skull and upper cervical spine: The craniocervical junction is
normal. Upper cervical spine is within normal limits. Marrow signal
is unremarkable.

Sinuses/Orbits: Chronic right maxillary sinus occlusion is again
noted. Minimal mucosal thickening is scattered through the ethmoid
air cells. Bilateral mastoid effusions are present. No obstructing
nasopharyngeal lesion is present. The paranasal sinuses and mastoid
air cells are otherwise clear. Bilateral lens replacements are
noted. Globes and orbits are otherwise unremarkable.
IMPRESSION: 1. Acute nonhemorrhagic infarct involving the left lentiform nucleus
and corona radiata. This likely involves cortical spinal tracts.
2. Progressive atrophy and white matter disease. This likely
reflects the sequela of chronic microvascular ischemia.
3. Chronic right maxillary sinus occlusion.
4. Bilateral mastoid effusions. No obstructing nasopharyngeal lesion
is present.

These results will be called to the ordering clinician or
representative by the Radiologist Assistant, and communication
documented in the PACS or [REDACTED].

## 2021-10-02 MED ORDER — ASPIRIN 81 MG PO CHEW
81.0000 mg | CHEWABLE_TABLET | Freq: Every day | ORAL | Status: DC
  Administered 2021-10-04 – 2021-10-06 (×3): 81 mg via ORAL
  Filled 2021-10-02 (×3): qty 1

## 2021-10-02 MED ORDER — CLOPIDOGREL BISULFATE 75 MG PO TABS
75.0000 mg | ORAL_TABLET | Freq: Every day | ORAL | Status: DC
  Administered 2021-10-04 – 2021-10-05 (×2): 75 mg via ORAL
  Filled 2021-10-02 (×2): qty 1

## 2021-10-02 MED ORDER — INSULIN ASPART 100 UNIT/ML IJ SOLN
0.0000 [IU] | INTRAMUSCULAR | Status: DC
  Administered 2021-10-02: 1 [IU] via SUBCUTANEOUS
  Administered 2021-10-03: 2 [IU] via SUBCUTANEOUS
  Administered 2021-10-03 (×2): 1 [IU] via SUBCUTANEOUS
  Administered 2021-10-03: 2 [IU] via SUBCUTANEOUS
  Administered 2021-10-04 (×2): 1 [IU] via SUBCUTANEOUS
  Administered 2021-10-04: 2 [IU] via SUBCUTANEOUS
  Administered 2021-10-04: 1 [IU] via SUBCUTANEOUS

## 2021-10-02 MED ORDER — GADOBUTROL 1 MMOL/ML IV SOLN
9.5000 mL | Freq: Once | INTRAVENOUS | Status: AC | PRN
  Administered 2021-10-02: 9.5 mL via INTRAVENOUS

## 2021-10-02 MED ORDER — INSULIN ASPART 100 UNIT/ML IJ SOLN: 0.0000 [IU] | INTRAMUSCULAR | Status: DC

## 2021-10-02 MED ORDER — ENOXAPARIN SODIUM 40 MG/0.4ML IJ SOSY
40.0000 mg | PREFILLED_SYRINGE | INTRAMUSCULAR | Status: DC
  Administered 2021-10-03 – 2021-10-05 (×3): 40 mg via SUBCUTANEOUS
  Filled 2021-10-02 (×3): qty 0.4

## 2021-10-02 MED ORDER — ASPIRIN 300 MG RE SUPP
300.0000 mg | Freq: Every day | RECTAL | Status: DC
  Administered 2021-10-02 – 2021-10-03 (×2): 300 mg via RECTAL
  Filled 2021-10-02 (×4): qty 1

## 2021-10-02 MED ORDER — METOPROLOL SUCCINATE ER 25 MG PO TB24
25.0000 mg | ORAL_TABLET | Freq: Every day | ORAL | Status: DC
  Filled 2021-10-02: qty 1

## 2021-10-02 MED ORDER — SODIUM CHLORIDE 0.9% FLUSH: 3.0000 mL | Freq: Once | INTRAVENOUS | Status: DC

## 2021-10-02 NOTE — ED Provider Notes (Signed)
Emergency Medicine Provider Triage Evaluation Note  Tamara Herring , a 76 y.o. female  was evaluated in triage.  Pt presenting by EMS due to right-sided facial droop and abnormal speech.  Reports history of CVA however is not fully participating in her history.    Per family to EMS, patient is with abnormal gait.  Review of Systems  Nods and shakes her head questions.  Positive: Weakness Negative: H/a  Physical Exam  BP (!) 192/104 (BP Location: Right Arm)    Pulse 72    Temp 98.7 F (37.1 C) (Oral)    Resp 16    SpO2 100%  Gen:   Awake, no distress   Resp:  Normal effort  MSK:   Patient with full range of motion in bilateral lower extremities.  5 out of 5 strength.  Decreased strength with right-sided finger grip.  Apparent right-sided facial droop, unable to raise eyebrows bilaterally.  Medical Decision Making  Medically screening exam initiated at 11:41 AM.  Appropriate orders placed.  Tamara Herring was informed that the remainder of the evaluation will be completed by another provider, this initial triage assessment does not replace that evaluation, and the importance of remaining in the ED until their evaluation is complete.     Tamara Benders, PA-C 10/02/21 1144    Tamara Manchester, MD 10/04/21 [phone]

## 2021-10-02 NOTE — H&P (Addendum)
[facility] Admission History and Physical Service Pager: [phone]  Patient name: [place] record number: [id number] Date of birth: 11/01/1945 Age: 76 y.o. Gender: female  Primary Care Provider: Karie Mainland, [place] Consultants: None Code Status: DNR Preferred Emergency Contact: Fabienne Dombroski (daughter) [phone]  Chief Complaint: facial droop and right sided weakness  Assessment and Plan: Shaelin Fregeau is a 76 y.o. female presenting with bilateral upper and lower extremity weakness and difficulty with speech. PMH is significant for HTN, HLD, T2DM, GERD, CKD3b, and questionable history of CAD w/ MI.   Facial droop and dysarthria, suspicious for stroke   HTN Patient presented with new onset right-sided facial droop, bilateral upper and lower extremity weakness R>L on exam but she endorses this to be more prominent on the left side.  Last known normal to be 2 PM yesterday, therefore patient presented greater than 24 hours after initial symptom onset.  EKG showed sinus arrhythmia without ST elevation.  CT head unremarkable.  CBC with hemoglobin 11.8, otherwise unremarkable.  CMP notably has normal glucose and without electrolyte abnormalities.  ED provider consulted neurology, who advised MRI brain and follow-up with neuro if positive. Patient's granddaughter notes that she appeared normal yesterday but appears a bit more confused today in addition to the symptoms listed above.  Patient is able to converse and does not appear disoriented but has minimal difficulty following directions for a cerebellar neuro exams.  Patient also presented with hypertension ranging from 150s-200s/80s-100s. Home medications include lisinopril 40mg  daily, Amlodipine 10mg  daily, metoprolol 25mg  (?). Medication list is unclear at this time and patient unable to verify.  -Admit to [facility], attending Dr. Gwendlyn Deutscher, progressive unit -Cardiac monitoring x 24 hours with continuous  pulse ox -Vital signs per unit -PT/OT eval and treat -SLP -MR brain w/wo contrast; consult neuro if positive -Lipid panel, TSH, HgbA1c -BMP daily -Consider echo if MR brain positive for stroke -Permissive HTN pending MRI results  HLD   CAD with MI?? Home medications include atorvastatin 20mg  daily, pravastatin 40mg  daily and ASA 81mg  daily. Most recent lipid profile on 07/01/20 notes hypertriglyceridemia at 232, LDL 97, HDL 51. Pt denied history of CVA. I am unsure why she has ASA on her current list of medications. Pt cannot verify medications. Will hold these medications until able to clarify why she is taking them given no other indications for ASA at this time and likely not taking multiple statin medications. May restart one of the statins upon completed med rec. Questionable upon charting whether or not patient has history of MI with CAD. Cardiology report on 09/18/2018 for stress echo notes global LVEF >50% without regional wall motion abnormalities, no evidence of resting ischemia or old infarct.  -Lipid panel -Hold atorvastatin/pravastatin and ASA  T2DM Per chart review, home medications include insulin NPH 70/30 30u nightly and Novolog 70/30 70u every morning before breakfast. Patient unable to verify her home meds. Most recent HgbA1c on 01/04/21 was 8.0. Pt is NPO at this time given facial droop and failed bedside swallow with RN. SLP to evaluate further and will plan to adjust diabetic regimen when pt is able to drink/eat and home meds are verified. Most recent glucose 137, stable and not requiring active intervention at this time.  -CBG monitoring q4h -Sensitive SSI -Hgb A1c  CKD IIIb Presents with Cr 1.5, GFR 34, minimally changed from GFR 40 on 01/04/21. Not requiring intervention at this time. -Daily BMP  Anemia Hgb 11.8 upon admission. Most recently 10.7 on  05/11/21 and wnl prior to then. Home medications include ferrous sulfate 325mg  daily. Likely iron-deficient anemia.  -Hold  medications while NPO  GERD Home medications include omeprazole 40mg  daily. -hold until completed med rec  FEN/GI: NPO Prophylaxis: Lovenox to start tomorrow  Disposition: progressive  History of Present Illness:  Daelynn Aminov is a 76 y.o. female presenting with stroke-like symptoms.  Patient reports facial droop, which started yesterday around 2pm. This morning she also developed difficulty with her speech and L>R sided weakness. According to granddaughter, family was able to understand her speech yesterday. Today, however, they had trouble understanding her. Patient also with difficulty walking today secondary to weakness.   Patient notes she took Ibuprofen and her metoprolol this morning. Thought the Ibuprofen may help her walk. Also had a headache but says this has resolved. She is unsure of her other medications- but she didn't take any of them today. She is unable to drink well as it all comes back out of her mouth.  Granddaughter reports she seems slightly confused currently.  Endorses some stomach pain earlier today which has resolved.  No vision changes, chest pain, shortness of breath, or other complaints. She denies any past history of CVA.    Review Of Systems: Per HPI with the following additions:   Review of Systems  Constitutional:  Negative for fever.  Eyes:  Negative for visual disturbance.  Respiratory:  Negative for shortness of breath.   Cardiovascular:  Negative for chest pain.  Gastrointestinal:  Negative for nausea and vomiting.  Neurological:  Positive for facial asymmetry, speech difficulty, weakness and headaches.    Patient Active Problem List   Diagnosis Date Noted   Right sided weakness 10/02/2021    Past Medical History: Past Medical History:  Diagnosis Date   Diabetes mellitus without complication ([place])    Hypertension     Past Surgical History: No past surgical history on file.  Social History: Social History   Tobacco Use    Smoking status: Never   Smokeless tobacco: Never   Additional social history: denies smoking, alcohol use, illicit drug use  Please also refer to relevant sections of EMR.  Family History: No family history on file.  Allergies and Medications: Allergies  Allergen Reactions   Codeine Swelling and Rash   Shrimp Extract Allergy Skin Test Swelling   Wound Dressing Adhesive Swelling   Nortriptyline Itching   Chocolate Flavor Rash   No current facility-administered medications on file prior to encounter.   No current outpatient medications on file prior to encounter.    Objective: BP (!) 157/95    Pulse 70    Temp 98.4 F (36.9 C)    Resp 16    SpO2 100%  Exam: General: A&O x 4, NAD, difficulty with speech Eyes: EOMI, PERRL ENTM: moist mucous membranes Neck: normal ROM Cardiovascular: RRR, no murmurs auscultated Respiratory: CTAB, no increased work of breathing Gastrointestinal: soft, nontender, normoactive bowel sounds MSK: strength 3/5 RUE and RLE, strength 4/5 LUE and LLE Derm: no rashes or lesions appreciated Neuro: dysarthria, right-sided facial droop, poor finger-to-nose, bilateral upper and lower extremity weakness R>L, capable in conversation but poorly able to annunciate  Psych: normal mood  Labs and Imaging: CBC BMET  Recent Labs  Lab 10/02/21 1151 10/02/21 1159  WBC 7.6  --   HGB 11.8* 12.9  HCT 36.8 38.0  PLT 251  --    Recent Labs  Lab 10/02/21 1151 10/02/21 1159  NA 135 138  K 4.0 4.0  CL 103 105  CO2 21*  --   BUN 16 17  CREATININE 1.56* 1.50*  GLUCOSE 137* 132*  CALCIUM 8.8*  --      EKG: normal sinus rhythm with sinus arrythmia, normal PR and QRS intervals, no ST elevations  CT HEAD WO CONTRAST  Result Date: 10/02/2021 CLINICAL DATA:  Neuro deficit, acute, stroke suspected. Right facial droop, dysarthria, and right facial, arm, and leg numbness. EXAM: CT HEAD WITHOUT CONTRAST TECHNIQUE: Contiguous axial images were obtained from the base of  the skull through the vertex without intravenous contrast. COMPARISON:  Head MRI 10/11/2018 FINDINGS: Brain: There is no evidence of an acute infarct, intracranial hemorrhage, mass, midline shift, or extra-axial fluid collection. The ventricles and sulci are within normal limits for age. Hypodensities in the cerebral white matter bilaterally are nonspecific but compatible with mild chronic small vessel ischemic disease. Vascular: Calcified atherosclerosis at the skull base. No hyperdense vessel. Skull: No fracture or suspicious osseous lesion. Sinuses/Orbits: Unchanged complete opacification of the right maxillary sinus. Trace right mastoid fluid. Bilateral cataract extraction. Other: None. IMPRESSION: 1. No evidence of acute intracranial abnormality. 2. Mild chronic small vessel ischemic disease. Electronically Signed   By: Logan Bores M.D.   On: 10/02/2021 13:05     Jojuan Champney Guiles, DO 10/02/2021, 5:08 PM PGY-1, [place] Intern pager: [phone], text pages welcome  FPTS Upper-Level Resident Addendum   I have independently interviewed and examined the patient. I have discussed the above with Dr. Madison Hickman and agree with the documented plan. My edits for correction/addition/clarification are included above. Please see any attending notes.   Alcus Dad, MD PGY-2, [facility] 10/02/2021 6:17 PM  [facility] Service pager: [phone] (text pages welcome through [facility])

## 2021-10-02 NOTE — ED Triage Notes (Addendum)
Patient BIB [facility] from home with right sided facial droop and dysarthria, last known well at approximately 1400 yesterday. No arm drift, sensation diminished on right face, arm, and leg. Patient is alert and oriented and in no apparent distress. Denies headache, reports feeling weak on left side. 18g in left AC. Family reported new abnormal gait at home. EMS vitals BP 200/120 CBG 177

## 2021-10-02 NOTE — Hospital Course (Addendum)
Tamara Herring is a 76 y.o.female with a history of HTN, HLD, T2DM, GERD, CKD 3B, CAD with MI who was admitted to the [facility] at [facility] for facial droop, dysarthria, bilateral lower extremity weakness R>L. Her hospital course is detailed below:  CVA Patient presented with new onset right-sided facial droop, bilateral upper and lower extremity weakness R>L.  She presented greater than 24 hours after initial symptom onset.  EKG notable for sinus arrhythmia without ST elevation.  CT head unremarkable.  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  Neurology was consulted and recommended aspirin 81 mg daily, Plavix 75 mg daily x21 days.  This was then to be followed by aspirin 81 mg alone daily.  Vascular ultrasound of carotids showed right left ICA 1 to 39% stenosed.  Echo showed LVEF 55 to 60% without regional wall motion abnormalities and no evidence of resting ischemia or old infarct but does present with severe concentric LVH.  Given facial droop and difficulty swallowing, SLP recommended acute inpatient rehab which is also echoed by PT recommendation. Patient denied inpatient rehab and elected to go home with home health. Patient has close family member who is PT and can work with patient.   HTN Neurology advised permissive hypertension in the setting of new onset CVA.  After 48 hours, patient was restarted on hypertensive medications, although unsure of patient's true home medications.  Patient started on amlodipine 10 mg.  Elevated Creatinine Patient admitted with elevated Ct to 1.67. Cr trended down, but then went back up after starting blood pressure medicine, lisinopril. Lisinopril was stopped and losartan was started. Cr. Continued to rise to 2.02, medication was promptly stopped, and patient was started on amlodipine 10 mg.   HLD   CAD with history of MI Lipid panel notable for LDL 97, HDL 53.  Started on atorvastatin 40  mg daily.  Given patient has significant cardiac history and risk factors, consider initiation of SGLT2 and weighing benefits and risks in the setting of kidney disease.  Anemia Questionable whether or not patient has component of anemia.  Ferrous sulfate 325 mg daily on her medication list.  Patient initially presented with hemoglobin 11.8.  No evidence of recent colonoscopy in chart discovery.  Recommend further work-up on outpatient basis given no history at this time of active bleeding.  PCP Follow-up Recommendations: Outpatient colonoscopy for anemia work-up Consider initiation of SGLT2 Medications discontinued at discharge and needs follow up: - Lasix 20 mg daily - oxybutynin 10 mg daily - Klor-con 10 mEq 4.   Check BMP to f/u Cr

## 2021-10-02 NOTE — Consult Note (Addendum)
NEUROLOGY CONSULTATION NOTE   Date of service: October 02, 2021 Patient Name: Tamara Herring MRN:  [id number] DOB:  June 15, 1946 Reason for consult: "left lentiform nucleus stroke" Requesting Provider: Kinnie Feil, MD _ _ _   _ __   _ __ _ _  __ __   _ __   __ _  History of Present Illness  Tamara Herring is a 76 y.o. female with PMH significant for DM2, HTN, HLD, CKD3b, GERD who presents with dysarthric speech and family having trouble understanding her. She also developed a left facial droop. Symptoms started around 1400 on 10/01/21.  She was cooking in the kitchen with her symptoms started. She also reports trouble walking but has been able to walk. She came to the ED when her symptoms did not improve.  She denies any prior history of stroke, no family history of stroke.  She reports that her blood pressure is not well controlled. SBP at home typically runs in 190s.  MRS: 0 TNKase/thrombectomy: Not offered due to outside the window.  NIHSS components Score: Comment  1a Level of Conscious 0[x]  1[]  2[]  3[]      1b LOC Questions 0[x]  1[]  2[]       1c LOC Commands 0[x]  1[]  2[]       2 Best Gaze 0[x]  1[]  2[]       3 Visual 0[x]  1[]  2[]  3[]      4 Facial Palsy 0[]  1[]  2[x]  3[]      5a Motor Arm - left 0[x]  1[]  2[]  3[]  4[]  UN[]    5b Motor Arm - Right 0[x]  1[]  2[]  3[]  4[]  UN[]    6a Motor Leg - Left 0[x]  1[]  2[]  3[]  4[]  UN[]    6b Motor Leg - Right 0[x]  1[]  2[]  3[]  4[]  UN[]    7 Limb Ataxia 0[x]  1[]  2[]  3[]  UN[]     8 Sensory 0[x]  1[]  2[]  UN[]      9 Best Language 0[x]  1[]  2[]  3[]      10 Dysarthria 0[]  1[]  2[x]  UN[]      11 Extinct. and Inattention 0[x]  1[]  2[]       TOTAL: 4     ROS   Constitutional Denies weight loss, fever and chills.   HEENT Denies changes in vision and hearing.   Respiratory Denies SOB and cough.   CV Denies palpitations and CP   GI Denies abdominal pain, nausea, vomiting and diarrhea.   GU Denies dysuria and urinary frequency.   MSK Denies myalgia and joint pain.    Skin Denies rash and pruritus.   Neurological Mild headache that has resolved, no syncope.   Psychiatric Denies recent changes in mood. Denies anxiety and depression.    Past History   Past Medical History:  Diagnosis Date   Diabetes mellitus without complication ([place])    Hypertension    No past surgical history on file. No family history on file. Social History   Socioeconomic History   Marital status: Divorced    Spouse name: Not on file   Number of children: Not on file   Years of education: Not on file   Highest education level: Not on file  Occupational History   Not on file  Tobacco Use   Smoking status: Never   Smokeless tobacco: Never  Substance and Sexual Activity   Alcohol use: Not on file   Drug use: Not on file   Sexual activity: Not on file  Other Topics Concern   Not on file  Social History Narrative   Not on file  Social Determinants of Health   Financial Resource Strain: Not on file  Food Insecurity: Not on file  Transportation Needs: Not on file  Physical Activity: Not on file  Stress: Not on file  Social Connections: Not on file   Allergies  Allergen Reactions   Codeine Swelling and Rash   Shrimp Extract Allergy Skin Test Swelling   Wound Dressing Adhesive Swelling   Nortriptyline Itching   Chocolate Flavor Rash    Medications  (Not in a hospital admission)    Vitals   Vitals:   10/02/21 1545 10/02/21 1752 10/02/21 1845 10/02/21 2015  BP: (!) 157/95 (!) 188/90 (!) 176/82 (!) 164/87  Pulse: 70 77 73 77  Resp: 16 17 19  (!) 25  Temp:      TempSrc:      SpO2: 100% 95% 99% 96%     There is no height or weight on file to calculate BMI.  Physical Exam   General: Laying comfortably in bed; in no acute distress.  HENT: Normal oropharynx and mucosa. Normal external appearance of ears and nose.  Neck: Supple, no pain or tenderness  CV: No JVD. No peripheral edema.  Pulmonary: Symmetric Chest rise. Normal  respiratory effort.  Abdomen: Soft to touch, non-tender.  Ext: No cyanosis, edema, or deformity  Skin: No rash. Normal palpation of skin.   Musculoskeletal: Normal digits and nails by inspection. No clubbing.   Neurologic Examination  Mental status/Cognition: Alert, oriented to self, place, month and year, good attention.  Speech/language: Severely dysarthric speech, fluent, comprehension intact, object naming intact, repetition intact.  Cranial nerves:   CN II Pupils equal and reactive to light, no VF deficits    CN III,IV,VI EOM intact, no gaze preference or deviation, no nystagmus    CN V normal sensation in V1, V2, and V3 segments bilaterally    CN VII Left facial droop.   CN VIII normal hearing to speech   CN IX & X normal palatal elevation, no uvular deviation    CN XI 5/5 head turn and 5/5 shoulder shrug bilaterally    CN XII midline tongue protrusion    Motor:  Muscle bulk: normal, tone normal, pronator drift mild LUE drift. tremor none Mvmt Root Nerve  Muscle Right Left Comments  SA C5/6 Ax Deltoid 5 5   EF C5/6 Mc Biceps 5 5   EE C6/7/8 Rad Triceps 5 4+   WF C6/7 Med FCR     WE C7/8 PIN ECU     F Ab C8/T1 U ADM/FDI 5 4+   HF L1/2/3 Fem Illopsoas 5 5   KE L2/3/4 Fem Quad 5 5   DF L4/5 D Peron Tib Ant 5 4   PF S1/2 Tibial Grc/Sol 5 5    Reflexes:  Right Left Comments  Pectoralis      Biceps (C5/6) 1 1   Brachioradialis (C5/6) 1 1    Triceps (C6/7) 1 1    Patellar (L3/4) 1 1    Achilles (S1) 1 1    Hoffman      Plantar Withdraws Withdraws   Jaw jerk    Sensation:  Light touch Intact throughout   Pin prick    Temperature    Vibration   Proprioception    Coordination/Complex Motor:  - Finger to Nose intact bilaterally - Heel to shin unable to do due to deconditioning no obvious ataxia bilaterally. - Rapid alternating movement are slowed - Gait: Unsafe to assess given left foot drop. Labs  CBC:  Recent Labs  Lab 10/02/21 1151 10/02/21 1159  WBC 7.6   --   NEUTROABS 4.3  --   HGB 11.8* 12.9  HCT 36.8 38.0  MCV 82.7  --   PLT 251  --     Basic Metabolic Panel:  Lab Results  Component Value Date   NA 138 10/02/2021   K 4.0 10/02/2021   CO2 21 (L) 10/02/2021   GLUCOSE 132 (H) 10/02/2021   BUN 17 10/02/2021   CREATININE 1.50 (H) 10/02/2021   CALCIUM 8.8 (L) 10/02/2021   GFRNONAA 34 (L) 10/02/2021   Lipid Panel:  Lab Results  Component Value Date   LDLCALC 97 10/02/2021   HgbA1c:  Lab Results  Component Value Date   HGBA1C 7.2 (H) 10/02/2021   Urine Drug Screen: No results found for: LABOPIA, COCAINSCRNUR, LABBENZ, AMPHETMU, THCU, LABBARB  Alcohol Level No results found for: [place]  CT Head without contrast(Personally reviewed): CTH was negative for a large hypodensity concerning for a large territory infarct or hyperdensity concerning for an ICH  MR Angio head without contrast and Carotid Duplex BL: pending  MRI Brain(Personally reviewed): Left lentiform nucleus and corona radiata stroke Impression   Tamara Herring is a 76 y.o. female with PMH significant for diabetes, hypertension, hyperlipidemia who presents with acute onset left facial droop, severely dysarthric speech and mild left upper extremity left lower extremity weakness.  She was found to have acute left lentiform nucleus and corona radiata stroke.  Suspect that this is probably a small vessel stroke.  Primary Diagnosis:  Other cerebral infarction due to occlusion of stenosis of small artery.  Secondary Diagnosis: Essential (primary) hypertension, Type 2 diabetes mellitus with hyperglycemia , and Obesity  Recommendations  Plan:  - Frequent Neuro checks per stroke unit protocol - Recommend Vascular imaging with MRA Angio Head without contrast and US Carotid doppler - Recommend obtaining TTE - Recommend obtaining Lipid panel with LDL - Please start statin if LDL > 70 - Recommend HbA1c - Antithrombotic - Aspirin 81mg  daily along with plavix 75mg   daily x 21 days followed by Aspirin 81mg  daily alone. - Recommend DVT ppx - SBP goal - permissive hypertension first 24 h < 220/110. Held home meds.  - Recommend Telemetry monitoring for arrythmia - Recommend bedside swallow screen prior to PO intake. - Stroke education booklet - Recommend PT/OT/SLP consult  ____________________________________________________________   Thank you for the opportunity to take part in the care of this patient. If you have any further questions, please contact the neurology consultation attending.  Signed,  [place] Pager Number [phone] _ _ _   _ __   _ __ _ _  __ __   _ __   __ _

## 2021-10-02 NOTE — Progress Notes (Addendum)
FPTS Brief Progress Note  S: Patient was laying comfortably in bed upon arrival.  She states she is feeling better overall and has no complaint other than generalized fatigue and hungry. She denies having any pains, dizziness or headache.   O: BP (!) 164/87    Pulse 77    Temp 98.4 F (36.9 C)    Resp (!) 25    SpO2 96%  General:Awake, well appearing, NAD HEENT: Atraumatic, EOMI, PERRL CV: RRR, no murmurs, normal S1/S2 Pulm: CTAB, good WOB on RA,  Abd: Soft, no distension, no tenderness Skin: dry, warm Ext: 4/5 strength on RUE and RLE, 3/5 strength on LUE and LLE. Sensation intact Neuro: Right facial droop, oriented x3, CN II-XII intact   A/P: stroke Patient with bilateral upper and lower extremity weakness with right greater than left. Impressive right sided facial droop with dysarthria on exam.  MRI showed nonischemic infarct of the left lentiform nucleus and corona radiata. To obtain MRA angio head without contrast and Caroid doppler US per Neuro.  -Neuro Following, appreciate rec -Continue plan per day team.  - Orders reviewed. Labs for AM ordered, which was adjusted as needed.    Alen Bleacher, MD 10/02/2021, 9:01 PM PGY-1, [facility] Night Resident  Please page [phone] with questions.

## 2021-10-02 NOTE — ED Notes (Signed)
Pt back from MRI at this time

## 2021-10-02 NOTE — ED Provider Notes (Signed)
[facility] EMERGENCY DEPARTMENT Provider Note   CSN: [id number] Arrival date & time: 10/02/21  1121     History  Chief Complaint  Patient presents with   stroke symptoms out of window    Tamara Herring is a 76 y.o. female with past medical history significant for DM, HTN, HLD who presents with strokelike symptoms.  The patient states that yesterday around 2 PM she started having right facial droop and dysarthria.  She also complains of left-sided weakness and numbness.  She denies any recent fevers, chills, cough, congestion, shortness of breath, sore throat, chest pain, nausea, vomiting, abdominal pain, or changes to bowel movements.  She does report increased urinary frequency over the past couple days, but denies dysuria.   Home Medications Prior to Admission medications   Not on File      Allergies    Codeine    Review of Systems   Review of Systems  Constitutional:  Negative for chills and fever.  HENT:  Negative for ear pain and sore throat.   Eyes:  Negative for pain and visual disturbance.  Respiratory:  Negative for cough and shortness of breath.   Cardiovascular:  Negative for chest pain and palpitations.  Gastrointestinal:  Negative for abdominal pain and vomiting.  Genitourinary:  Positive for frequency. Negative for dysuria and hematuria.  Musculoskeletal:  Negative for arthralgias and back pain.  Skin:  Negative for color change and rash.  Neurological:  Positive for facial asymmetry, speech difficulty, weakness and numbness. Negative for seizures and syncope.  All other systems reviewed and are negative.  Physical Exam Updated Vital Signs BP (!) 194/93    Pulse 82    Temp 98.4 F (36.9 C)    Resp 14    SpO2 99%  Physical Exam Vitals and nursing note reviewed.  Constitutional:      General: She is not in acute distress.    Appearance: She is well-developed.  HENT:     Head: Normocephalic and atraumatic.     Right Ear: External ear  normal.     Left Ear: External ear normal.     Nose: Nose normal.     Mouth/Throat:     Pharynx: Oropharynx is clear.  Eyes:     Extraocular Movements: Extraocular movements intact.     Conjunctiva/sclera: Conjunctivae normal.     Pupils: Pupils are equal, round, and reactive to light.  Cardiovascular:     Rate and Rhythm: Normal rate and regular rhythm.     Heart sounds: No murmur heard. Pulmonary:     Effort: Pulmonary effort is normal. No respiratory distress.     Breath sounds: Normal breath sounds.  Abdominal:     Palpations: Abdomen is soft.     Tenderness: There is no abdominal tenderness.  Musculoskeletal:        General: No swelling.     Cervical back: Neck supple.  Skin:    General: Skin is warm and dry.     Capillary Refill: Capillary refill takes less than 2 seconds.  Neurological:     Mental Status: She is alert.     GCS: GCS eye subscore is 4. GCS verbal subscore is 5. GCS motor subscore is 6.     Cranial Nerves: Dysarthria and facial asymmetry (Right facial droop) present.     Sensory: Sensory deficit (Decreased sensation on right hemibody) present.     Motor: Weakness (4-/5 strength on right hemibody) present.     Coordination: Coordination is  intact.  Psychiatric:        Mood and Affect: Mood normal.    ED Results / Procedures / Treatments   Labs (all labs ordered are listed, but only abnormal results are displayed) Labs Reviewed  CBC - Abnormal; Notable for the following components:      Result Value   Hemoglobin 11.8 (*)    RDW 15.9 (*)    All other components within normal limits  COMPREHENSIVE METABOLIC PANEL - Abnormal; Notable for the following components:   CO2 21 (*)    Glucose, Bld 137 (*)    Creatinine, Ser 1.56 (*)    Calcium 8.8 (*)    GFR, Estimated 34 (*)    All other components within normal limits  I-STAT CHEM 8, ED - Abnormal; Notable for the following components:   Creatinine, Ser 1.50 (*)    Glucose, Bld 132 (*)    Calcium, Ion  1.11 (*)    All other components within normal limits  PROTIME-INR  APTT  DIFFERENTIAL  CBG MONITORING, ED    EKG None  Radiology CT HEAD WO CONTRAST  Result Date: 10/02/2021 CLINICAL DATA:  Neuro deficit, acute, stroke suspected. Right facial droop, dysarthria, and right facial, arm, and leg numbness. EXAM: CT HEAD WITHOUT CONTRAST TECHNIQUE: Contiguous axial images were obtained from the base of the skull through the vertex without intravenous contrast. COMPARISON:  Head MRI 10/11/2018 FINDINGS: Brain: There is no evidence of an acute infarct, intracranial hemorrhage, mass, midline shift, or extra-axial fluid collection. The ventricles and sulci are within normal limits for age. Hypodensities in the cerebral white matter bilaterally are nonspecific but compatible with mild chronic small vessel ischemic disease. Vascular: Calcified atherosclerosis at the skull base. No hyperdense vessel. Skull: No fracture or suspicious osseous lesion. Sinuses/Orbits: Unchanged complete opacification of the right maxillary sinus. Trace right mastoid fluid. Bilateral cataract extraction. Other: None. IMPRESSION: 1. No evidence of acute intracranial abnormality. 2. Mild chronic small vessel ischemic disease. Electronically Signed   By: Logan Bores M.D.   On: 10/02/2021 13:05    Procedures Procedures    Medications Ordered in ED Medications  sodium chloride flush (NS) 0.9 % injection 3 mL (has no administration in time range)    ED Course/ Medical Decision Making/ A&P                           Patient presents with right-sided facial droop and weakness as described in HPI above.  On my evaluation, patient indeed does have a right facial droop, dysarthria, and right-sided weakness however the patient says she feels weaker on her left side.  She also told me that she has less sensation to light touch on the right, but is complaining of numbness and tingling in the left.  Given last known normal was 2 PM  yesterday, patient is outside the window for thrombolytics or thrombectomy.    There is no evidence of stroke on CT head.  Patient's labs are notable for anemia and elevated creatinine that both appear baseline.    Patient's symptoms are very consistent with stroke and I believe that MRI is warranted.  I discussed the patient with neurology who said the patient should get the MRI, and the admitting team can officially consult them if it is positive for stroke.  I discussed the patient with the family medicine team who will admit the patient.   Final Clinical Impression(s) / ED Diagnoses Final diagnoses:  Cerebrovascular accident (CVA), unspecified mechanism [facility])    Rx / DC Orders ED Discharge Orders     None         Onalee Steinbach, Amalia Hailey, MD 10/02/21 Lurena Nida    Sherwood Gambler, MD 10/05/21 1911

## 2021-10-02 NOTE — ED Notes (Signed)
Patient transported to MRI 

## 2021-10-03 ENCOUNTER — Observation Stay (HOSPITAL_COMMUNITY): Payer: Medicare Other

## 2021-10-03 ENCOUNTER — Inpatient Hospital Stay (HOSPITAL_COMMUNITY): Payer: Medicare Other

## 2021-10-03 ENCOUNTER — Encounter (HOSPITAL_COMMUNITY): Payer: Commercial Managed Care - HMO

## 2021-10-03 DIAGNOSIS — I6389 Other cerebral infarction: Secondary | ICD-10-CM

## 2021-10-03 DIAGNOSIS — R531 Weakness: Secondary | ICD-10-CM | POA: Diagnosis not present

## 2021-10-03 DIAGNOSIS — E669 Obesity, unspecified: Secondary | ICD-10-CM | POA: Diagnosis present

## 2021-10-03 DIAGNOSIS — R29703 NIHSS score 3: Secondary | ICD-10-CM | POA: Diagnosis not present

## 2021-10-03 DIAGNOSIS — I2510 Atherosclerotic heart disease of native coronary artery without angina pectoris: Secondary | ICD-10-CM | POA: Diagnosis present

## 2021-10-03 DIAGNOSIS — I639 Cerebral infarction, unspecified: Secondary | ICD-10-CM | POA: Diagnosis not present

## 2021-10-03 DIAGNOSIS — G8191 Hemiplegia, unspecified affecting right dominant side: Secondary | ICD-10-CM | POA: Diagnosis present

## 2021-10-03 DIAGNOSIS — I252 Old myocardial infarction: Secondary | ICD-10-CM | POA: Diagnosis not present

## 2021-10-03 DIAGNOSIS — G4730 Sleep apnea, unspecified: Secondary | ICD-10-CM | POA: Diagnosis present

## 2021-10-03 DIAGNOSIS — R29705 NIHSS score 5: Secondary | ICD-10-CM | POA: Diagnosis not present

## 2021-10-03 DIAGNOSIS — R29706 NIHSS score 6: Secondary | ICD-10-CM | POA: Diagnosis not present

## 2021-10-03 DIAGNOSIS — R29810 Facial weakness: Secondary | ICD-10-CM | POA: Diagnosis present

## 2021-10-03 DIAGNOSIS — D509 Iron deficiency anemia, unspecified: Secondary | ICD-10-CM | POA: Diagnosis present

## 2021-10-03 DIAGNOSIS — R29707 NIHSS score 7: Secondary | ICD-10-CM | POA: Diagnosis not present

## 2021-10-03 DIAGNOSIS — R350 Frequency of micturition: Secondary | ICD-10-CM | POA: Diagnosis present

## 2021-10-03 DIAGNOSIS — R29704 NIHSS score 4: Secondary | ICD-10-CM | POA: Diagnosis present

## 2021-10-03 DIAGNOSIS — R471 Dysarthria and anarthria: Secondary | ICD-10-CM | POA: Diagnosis present

## 2021-10-03 DIAGNOSIS — Z66 Do not resuscitate: Secondary | ICD-10-CM | POA: Diagnosis present

## 2021-10-03 DIAGNOSIS — E785 Hyperlipidemia, unspecified: Secondary | ICD-10-CM | POA: Diagnosis present

## 2021-10-03 DIAGNOSIS — K148 Other diseases of tongue: Secondary | ICD-10-CM | POA: Diagnosis present

## 2021-10-03 DIAGNOSIS — Z20822 Contact with and (suspected) exposure to covid-19: Secondary | ICD-10-CM | POA: Diagnosis present

## 2021-10-03 DIAGNOSIS — I63512 Cerebral infarction due to unspecified occlusion or stenosis of left middle cerebral artery: Secondary | ICD-10-CM | POA: Diagnosis present

## 2021-10-03 DIAGNOSIS — E1122 Type 2 diabetes mellitus with diabetic chronic kidney disease: Secondary | ICD-10-CM | POA: Diagnosis present

## 2021-10-03 DIAGNOSIS — N1832 Chronic kidney disease, stage 3b: Secondary | ICD-10-CM | POA: Diagnosis present

## 2021-10-03 DIAGNOSIS — K219 Gastro-esophageal reflux disease without esophagitis: Secondary | ICD-10-CM | POA: Diagnosis present

## 2021-10-03 DIAGNOSIS — I129 Hypertensive chronic kidney disease with stage 1 through stage 4 chronic kidney disease, or unspecified chronic kidney disease: Secondary | ICD-10-CM | POA: Diagnosis present

## 2021-10-03 DIAGNOSIS — E1165 Type 2 diabetes mellitus with hyperglycemia: Secondary | ICD-10-CM | POA: Diagnosis present

## 2021-10-03 LAB — GLUCOSE, CAPILLARY
Glucose-Capillary: 103 mg/dL — ABNORMAL HIGH (ref 70–99)
Glucose-Capillary: 166 mg/dL — ABNORMAL HIGH (ref 70–99)
Glucose-Capillary: 174 mg/dL — ABNORMAL HIGH (ref 70–99)

## 2021-10-03 LAB — BASIC METABOLIC PANEL
Anion gap: 12 (ref 5–15)
BUN: 21 mg/dL (ref 8–23)
CO2: 21 mmol/L — ABNORMAL LOW (ref 22–32)
Calcium: 8.9 mg/dL (ref 8.9–10.3)
Chloride: 103 mmol/L (ref 98–111)
Creatinine, Ser: 1.68 mg/dL — ABNORMAL HIGH (ref 0.44–1.00)
GFR, Estimated: 31 mL/min — ABNORMAL LOW (ref >60)
Glucose, Bld: 144 mg/dL — ABNORMAL HIGH (ref 70–99)
Potassium: 3.9 mmol/L (ref 3.5–5.1)
Sodium: 136 mmol/L (ref 135–145)

## 2021-10-03 LAB — RESP PANEL BY RT-PCR (FLU A&B, COVID) ARPGX2
Influenza A by PCR: NEGATIVE
Influenza B by PCR: NEGATIVE
SARS Coronavirus 2 by RT PCR: NEGATIVE

## 2021-10-03 LAB — ECHOCARDIOGRAM COMPLETE
Area-P 1/2: 6.54 cm2
S' Lateral: 3.4 cm

## 2021-10-03 LAB — CBG MONITORING, ED
Glucose-Capillary: 127 mg/dL — ABNORMAL HIGH (ref 70–99)
Glucose-Capillary: 108 mg/dL — ABNORMAL HIGH (ref 70–99)
Glucose-Capillary: 127 mg/dL — ABNORMAL HIGH (ref 70–99)

## 2021-10-03 IMAGING — MR MR MRA HEAD W/O CM
1 series · 19 of 48 positions shown · non-contrast
Comparison: Brain MRI [DATE], [DATE]

CLINICAL DATA: 75-year-old female with neurologic deficit. Brain
MRI yesterday with acute small vessel lacunar infarct left corona
radiata and lentiform.

EXAM:
MRA HEAD WITHOUT CONTRAST
TECHNIQUE: Angiographic images of the Circle of Willis were acquired using MRA
technique without intravenous contrast.

[Series 5: 3d cow · axial · 0.5mm · 0.41mm/px · z∈[-91,-10]mm · 19 of 172 slices shown]
[im 1/172]
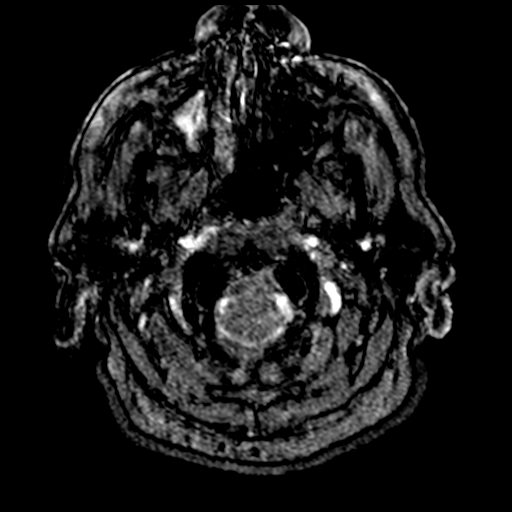
[im 4/172]
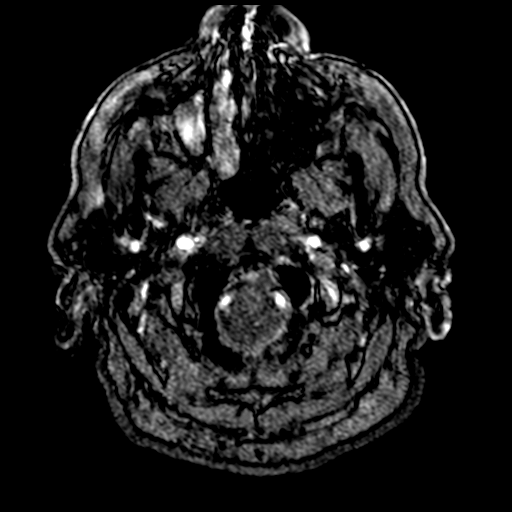
[im 8/172]
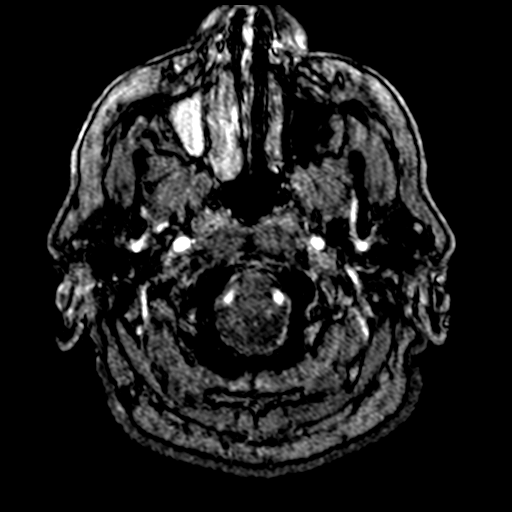
[im 11/172]
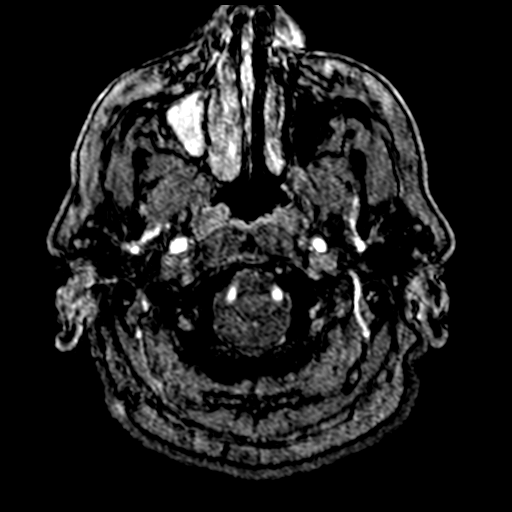
[im 15/172]
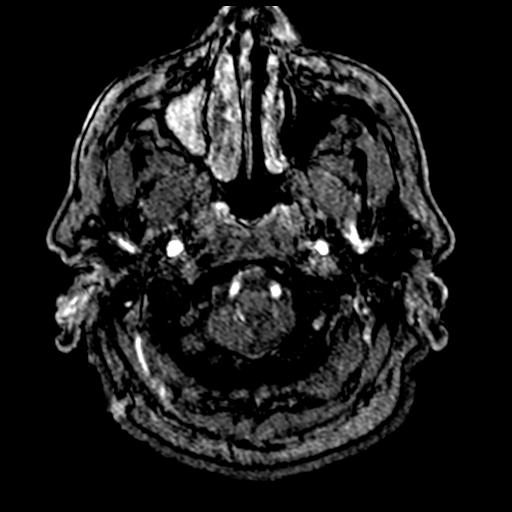
[im 19/172]
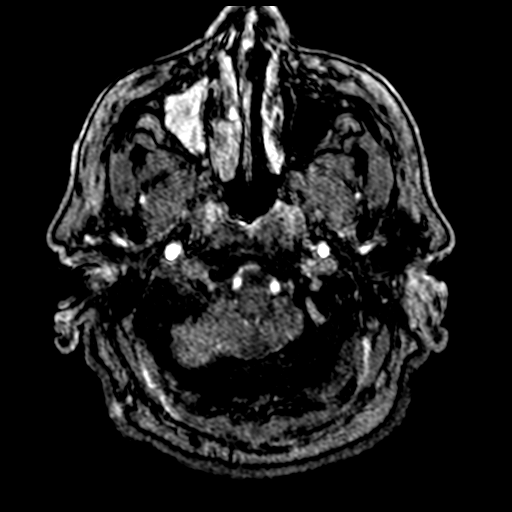
[im 22/172]
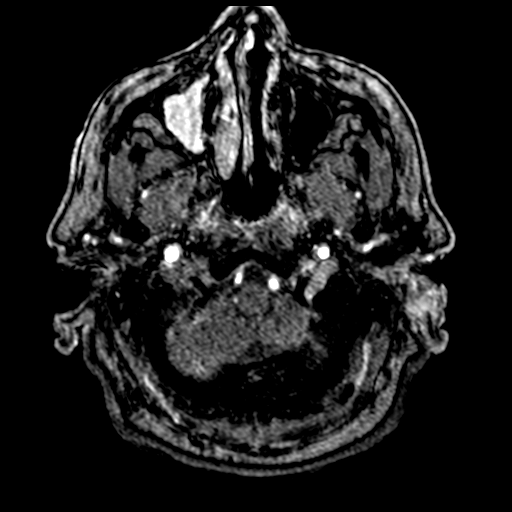
[im 26/172]
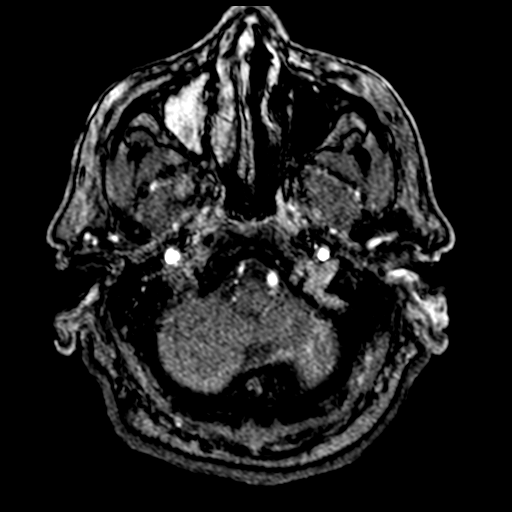
[im 30/172]
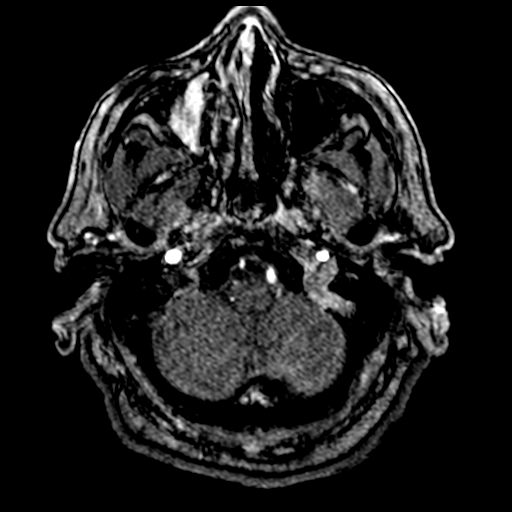
[im 33/172]
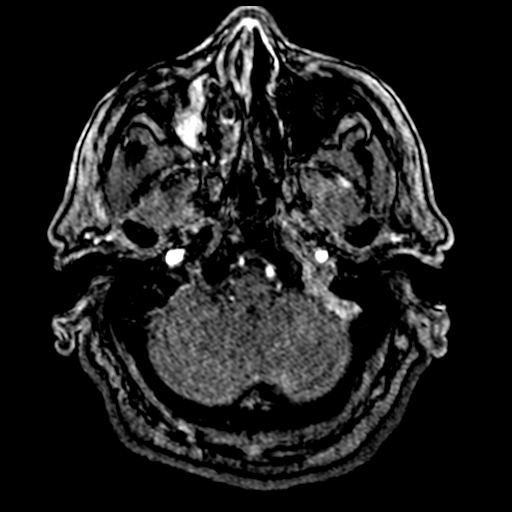
[im 37/172]
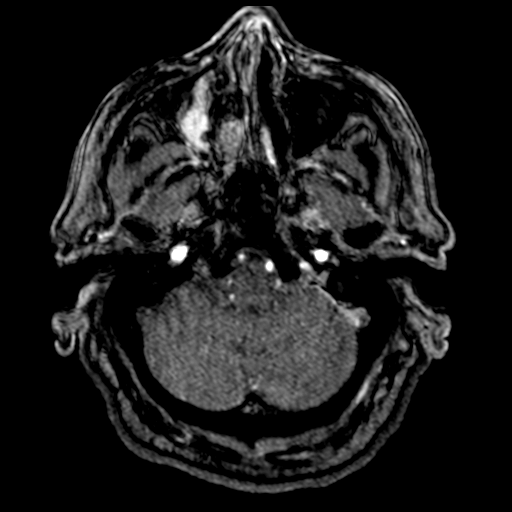
[im 55/172]
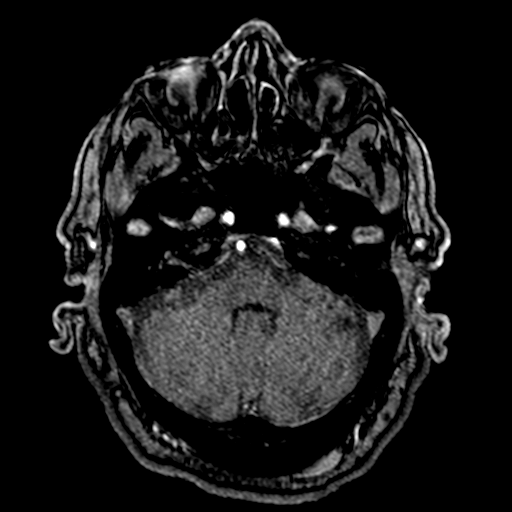
[im 77/172]
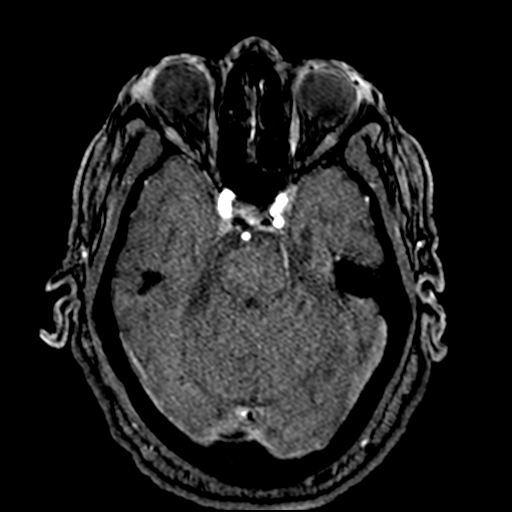
[im 88/172]
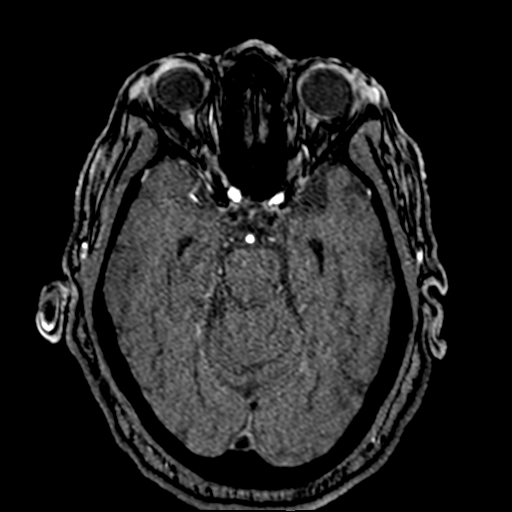
[im 99/172]
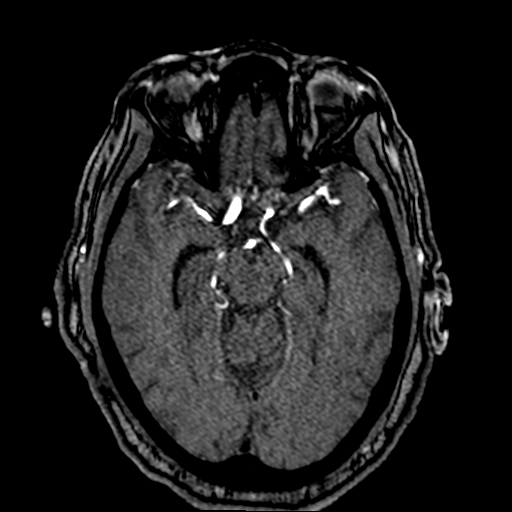
[im 121/172]
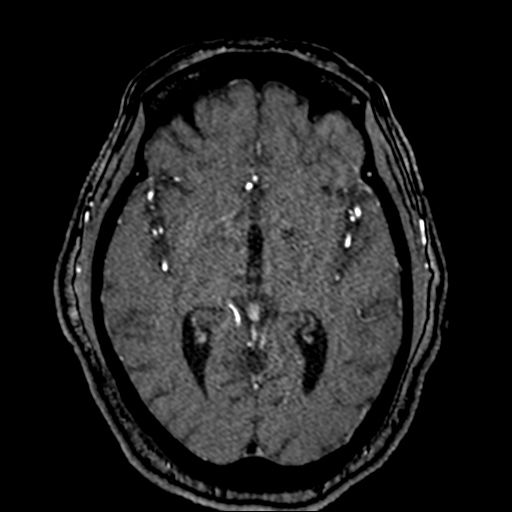
[im 142/172]
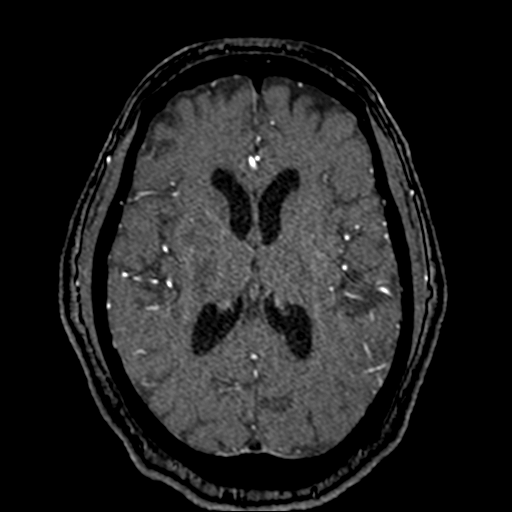
[im 146/172]
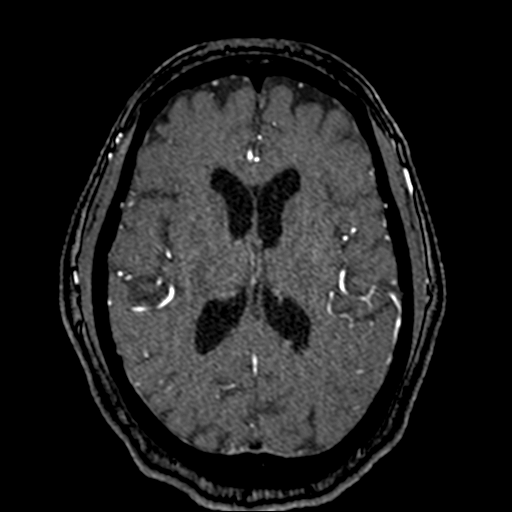
[im 164/172]
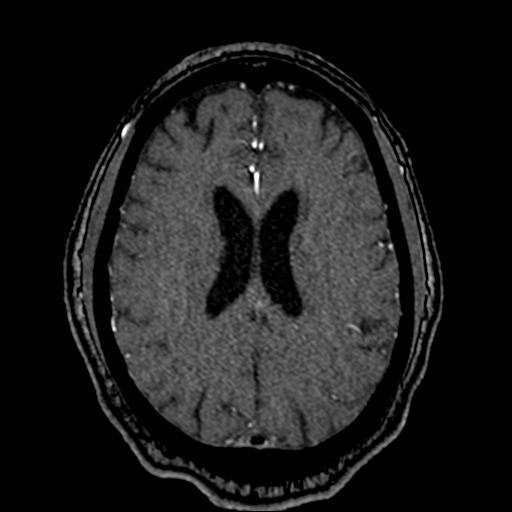

[19 of 48 positions shown; findings below may reference images not displayed]

FINDINGS: Posterior circulation: Antegrade flow in the posterior circulation.
Dominant appearing left vertebral V4 segment, and the distal right
vertebral artery appears diminutive beyond the PICA origin. Both
PICA origins and vertebrobasilar junction appear to be patent
without significant stenosis. Patent basilar artery without
stenosis. Patent AICA, SCA and PCA origins. Fetal type left PCA.
Right posterior communicating artery diminutive or absent. Bilateral
PCA branches are within normal limits.

Anterior circulation: Antegrade flow in both ICA siphons. Mild
siphon irregularity in keeping with atherosclerosis but no
significant siphon stenosis. Right ICA siphon appears dominant.
Normal left posterior communicating artery. Ophthalmic artery
origins within normal limits. Patent carotid termini. Dominant right
and diminutive or absent left ACA A1 segments. Anterior
communicating artery and visible ACA branches are within normal
limits. Right MCA M1 segment and bifurcation appear patent without
stenosis.

Left MCA M1 segment and trifurcation appear patent without stenosis.
Visible bilateral MCA branches are within normal limits.

Anatomic variants: Dominant right ICA siphon, right ACA A1. Fetal
left PCA origin. Dominant distal left vertebral artery.

Other: No intracranial mass effect.
IMPRESSION: Negative intracranial MRA for age.
No large vessel occlusion. Intracranial atherosclerosis with no
significant stenosis.

## 2021-10-03 MED ORDER — ATORVASTATIN CALCIUM 40 MG PO TABS
40.0000 mg | ORAL_TABLET | Freq: Every day | ORAL | Status: DC
  Administered 2021-10-04 – 2021-10-06 (×3): 40 mg via ORAL
  Filled 2021-10-03 (×3): qty 1

## 2021-10-03 NOTE — Progress Notes (Addendum)
STROKE TEAM PROGRESS NOTE   INTERVAL HISTORY Patient is seen in her room with her daughter at the bedside.  Yesterday, she presented after developing dysarthric speech and right sided facial droop.   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  TNK was not given and thrombectomy was not performed as patient presented outside the window.  She reports that her blood pressure is not well controlled at home.   MR angiogram of the brain shows no significant large vessel stenosis or occlusion.  Echocardiogram and carotid ultrasound is pending.  LDL cholesterol is 97 mg percent.  Hemoglobin A1c 7.2.  Patient does snore and appears to be at risk for sleep apnea and is interested in considering participation with sleep smart study for sleep apnea and stroke prevention. Vitals:   10/03/21 1000 10/03/21 1100 10/03/21 1200 10/03/21 1300  BP: (!) 185/97 (!) 177/76 (!) 177/91 (!) 185/81  Pulse: 90 88 86 82  Resp: (!) 21 15 (!) 25 (!) 23  Temp:      TempSrc:      SpO2: 97% 97% 97% 94%   CBC:  Recent Labs  Lab 10/02/21 1151 10/02/21 1159  WBC 7.6  --   NEUTROABS 4.3  --   HGB 11.8* 12.9  HCT 36.8 38.0  MCV 82.7  --   PLT 251  --    Basic Metabolic Panel:  Recent Labs  Lab 10/02/21 1151 10/02/21 1159  NA 135 138  K 4.0 4.0  CL 103 105  CO2 21*  --   GLUCOSE 137* 132*  BUN 16 17  CREATININE 1.56* 1.50*  CALCIUM 8.8*  --    Lipid Panel:  Recent Labs  Lab 10/02/21 1701  CHOL 168  TRIG 89  HDL 53  CHOLHDL 3.2  VLDL 18  LDLCALC 97   HgbA1c:  Recent Labs  Lab 10/02/21 1701  HGBA1C 7.2*   Urine Drug Screen: No results for input(s): LABOPIA, COCAINSCRNUR, LABBENZ, AMPHETMU, THCU, LABBARB in the last 168 hours.  Alcohol Level No results for input(s): ETH in the last 168 hours.  IMAGING past 24 hours MR ANGIO HEAD WO CONTRAST  Result Date: 10/03/2021 CLINICAL DATA:  76 year old female with neurologic deficit. Brain MRI yesterday with acute  small vessel lacunar infarct left corona radiata and lentiform. EXAM: MRA HEAD WITHOUT CONTRAST TECHNIQUE: Angiographic images of the Circle of Willis were acquired using MRA technique without intravenous contrast. COMPARISON:  Brain MRI 10/02/2021, 08/02/2019 FINDINGS: Posterior circulation: Antegrade flow in the posterior circulation. Dominant appearing left vertebral V4 segment, and the distal right vertebral artery appears diminutive beyond the PICA origin. Both PICA origins and vertebrobasilar junction appear to be patent without significant stenosis. Patent basilar artery without stenosis. Patent AICA, SCA and PCA origins. Fetal type left PCA. Right posterior communicating artery diminutive or absent. Bilateral PCA branches are within normal limits. Anterior circulation: Antegrade flow in both ICA siphons. Mild siphon irregularity in keeping with atherosclerosis but no significant siphon stenosis. Right ICA siphon appears dominant. Normal left posterior communicating artery. Ophthalmic artery origins within normal limits. Patent carotid termini. Dominant right and diminutive or absent left ACA A1 segments. Anterior communicating artery and visible ACA branches are within normal limits. Right MCA M1 segment and bifurcation appear patent without stenosis. Left MCA M1 segment and trifurcation appear patent without stenosis. Visible bilateral MCA branches are within normal limits. Anatomic variants: Dominant right ICA siphon, right ACA A1. Fetal left PCA origin. Dominant distal  left vertebral artery. Other: No intracranial mass effect. IMPRESSION: Negative intracranial MRA for age. No large vessel occlusion. Intracranial atherosclerosis with no significant stenosis. Electronically Signed   By: Genevie Ann M.D.   On: 10/03/2021 06:07   MR Brain W and Wo Contrast  Result Date: 10/02/2021 CLINICAL DATA:  Neuro deficit, acute, stroke suspected. Right facial droop. Right upper lower extremity numbness. Dysarthria. EXAM:  MRI HEAD WITHOUT AND WITH CONTRAST TECHNIQUE: Multiplanar, multiecho pulse sequences of the brain and surrounding structures were obtained without and with intravenous contrast. CONTRAST:  9.15mL GADAVIST GADOBUTROL 1 MMOL/ML IV SOLN COMPARISON:  CT head without contrast 10/02/2021. MR head without contrast 10/11/2018 FINDINGS: Brain: Acute nonhemorrhagic infarct extends from the left lentiform nucleus into the corona radiata measuring 13 x 11 mm. Mild generalized atrophy has progressed. Moderate periventricular and subcortical white matter T2 hyperintensities have progressed as well. No acute hemorrhage or mass lesion is present. The internal auditory canals are within normal limits. A The brainstem and cerebellum are within normal limits. Vascular: Flow is present in the major intracranial arteries. Skull and upper cervical spine: The craniocervical junction is normal. Upper cervical spine is within normal limits. Marrow signal is unremarkable. 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 Minimal mucosal thickening is scattered through the ethmoid air cells. Bilateral mastoid effusions are present. No obstructing nasopharyngeal lesion is present. The paranasal sinuses and mastoid air cells are otherwise clear. Bilateral lens replacements are noted. Globes and orbits are otherwise unremarkable. IMPRESSION: 1. Acute nonhemorrhagic infarct involving the left lentiform nucleus and corona radiata. This likely involves cortical spinal tracts. 2. Progressive atrophy and white matter disease. This likely reflects the sequela of chronic microvascular ischemia. 3. Chronic right maxillary sinus occlusion. 4. Bilateral mastoid effusions. No obstructing nasopharyngeal lesion is present. These results will be called to the ordering clinician or representative by the Radiologist Assistant, and communication documented in the PACS or [employer]. Electronically Signed   By: San Morelle M.D.    On: 10/02/2021 17:41   ECHOCARDIOGRAM COMPLETE  Result Date: 10/03/2021    ECHOCARDIOGRAM REPORT   Patient Name:   Tamara Herring Date of Exam: 10/03/2021 Medical Rec #:  [id number]      Height:       66.0 in Accession #:    [id number]     Weight:       216.0 lb Date of Birth:  [birth year]       BSA:          2.066 m Patient Age:    32 years       BP:           177/76 mmHg Patient Gender: F              HR:           82 bpm. Exam Location:  Inpatient Procedure: 2D Echo, Cardiac Doppler, Color Doppler and Strain Analysis Indications:   Stroke  History:       Patient has no prior history of Echocardiogram examinations.                Stroke.  Sonographer:   Glo Herring Referring      [place]: IMPRESSIONS  1. Left ventricular ejection fraction, by estimation, is 55 to 60%. The left ventricle has normal function. The left ventricle has no regional wall motion abnormalities. There is severe concentric left ventricular hypertrophy. Left ventricular diastolic  parameters are indeterminate.  2. Right  ventricular systolic function is normal. The right ventricular size is normal.  3. The mitral valve is normal in structure. No evidence of mitral valve regurgitation. No evidence of mitral stenosis.  4. The aortic valve is tricuspid. Aortic valve regurgitation is not visualized. No aortic stenosis is present.  5. The inferior vena cava is normal in size with greater than 50% respiratory variability, suggesting right atrial pressure of 3 mmHg. Comparison(s): No prior Echocardiogram. FINDINGS  Left Ventricle: Left ventricular ejection fraction, by estimation, is 55 to 60%. The left ventricle has normal function. The left ventricle has no regional wall motion abnormalities. Global longitudinal strain performed but not reported based on interpreter judgement due to suboptimal tracking. The left ventricular internal cavity size was normal in size. There is severe concentric left ventricular hypertrophy. Left  ventricular diastolic parameters are indeterminate. Right Ventricle: The right ventricular size is normal. No increase in right ventricular wall thickness. Right ventricular systolic function is normal. Left Atrium: Left atrial size was normal in size. Right Atrium: Right atrial size was normal in size. Pericardium: There is no evidence of pericardial effusion. Presence of epicardial fat layer. Mitral Valve: The mitral valve is normal in structure. Mild mitral annular calcification. No evidence of mitral valve regurgitation. No evidence of mitral valve stenosis. Tricuspid Valve: The tricuspid valve is normal in structure. Tricuspid valve regurgitation is mild . No evidence of tricuspid stenosis. Aortic Valve: The aortic valve is tricuspid. Aortic valve regurgitation is not visualized. No aortic stenosis is present. Pulmonic Valve: The pulmonic valve was not well visualized. Pulmonic valve regurgitation is not visualized. No evidence of pulmonic stenosis. Aorta: The aortic root is normal in size and structure. Venous: The inferior vena cava is normal in size with greater than 50% respiratory variability, suggesting right atrial pressure of 3 mmHg. IAS/Shunts: No atrial level shunt detected by color flow Doppler.  LEFT VENTRICLE PLAX 2D LVIDd:         4.30 cm   Diastology LVIDs:         3.40 cm   LV e' medial:   3.59 cm/s LV PW:         1.90 cm   LV E/e' medial: 17.1 LV IVS:        1.80 cm LVOT diam:     2.20 cm LVOT Area:     3.80 cm  RIGHT VENTRICLE RV Basal diam:  3.50 cm RV S prime:     9.57 cm/s LEFT ATRIUM           Index        RIGHT ATRIUM           Index LA diam:      3.80 cm 1.84 cm/m   RA Area:     18.40 cm LA Vol (A4C): 63.1 ml 30.54 ml/m  RA Volume:   44.90 ml  21.73 ml/m                        PULMONIC VALVE AORTA                 PV Vmax:       0.97 m/s Ao Root diam: 3.30 cm PV Peak grad:  3.7 mmHg Ao Asc diam:  3.30 cm  MITRAL VALVE MV Area (PHT): 6.54 cm     SHUNTS MV Decel Time: 116 msec      Systemic Diam: 2.20 cm MV E velocity: 61.30 cm/s MV A velocity: 115.00 cm/s  MV E/A ratio:  0.53 Kardie Tobb DO Electronically signed by Berniece Salines DO Signature Date/Time: 10/03/2021/12:52:45 PM    Final    VAS US CAROTID  Result Date: 10/03/2021 Carotid Arterial Duplex Study Patient Name:  Oasis Herring  Date of Exam:   10/03/2021 Medical Rec #: [id number]       Accession #:    [id number] Date of Birth: Jul 17, 1946        Patient Gender: F Patient Age:   67 years Exam Location:  [facility] Procedure:      VAS US CAROTID Referring Phys: Alferd Patee [facility]ring --------------------------------------------------------------------------------  Indications:       CVA. Risk Factors:      Hypertension, Diabetes. Comparison Study:  no prior Performing Technologist: Archie Patten RVS  Examination Guidelines: A complete evaluation includes B-mode imaging, spectral Doppler, color Doppler, and power Doppler as needed of all accessible portions of each vessel. Bilateral testing is considered an integral part of a complete examination. Limited examinations for reoccurring indications may be performed as noted.  Right Carotid Findings: +----------+--------+--------+--------+------------------+--------+             PSV cm/s EDV cm/s Stenosis Plaque Description Comments  +----------+--------+--------+--------+------------------+--------+  CCA Prox   111      15                heterogenous                 +----------+--------+--------+--------+------------------+--------+  CCA Distal 43       8                 heterogenous                 +----------+--------+--------+--------+------------------+--------+  ICA Prox   53       15       1-39%    heterogenous                 +----------+--------+--------+--------+------------------+--------+  ICA Distal 94       27                                             +----------+--------+--------+--------+------------------+--------+  ECA        60                                                       +----------+--------+--------+--------+------------------+--------+ +----------+--------+-------+--------+-------------------+             PSV cm/s EDV cms Describe Arm Pressure (mmHG)  +----------+--------+-------+--------+-------------------+  Subclavian 72                                             +----------+--------+-------+--------+-------------------+ +---------+--------+--+--------+--+---------+  Vertebral PSV cm/s 48 EDV cm/s 13 Antegrade  +---------+--------+--+--------+--+---------+  Left Carotid Findings: +----------+--------+--------+--------+------------------+--------+             PSV cm/s EDV cm/s Stenosis Plaque Description Comments  +----------+--------+--------+--------+------------------+--------+  CCA Prox   84       9                 heterogenous                 +----------+--------+--------+--------+------------------+--------+  CCA Distal 55       8                 heterogenous                 +----------+--------+--------+--------+------------------+--------+  ICA Prox   53       16       1-39%    heterogenous                 +----------+--------+--------+--------+------------------+--------+  ICA Distal 54       14                                             +----------+--------+--------+--------+------------------+--------+  ECA        71                                                      +----------+--------+--------+--------+------------------+--------+ +----------+--------+--------+--------+-------------------+             PSV cm/s EDV cm/s Describe Arm Pressure (mmHG)  +----------+--------+--------+--------+-------------------+  Subclavian 70                                              +----------+--------+--------+--------+-------------------+ +---------+--------+--+--------+--+---------+  Vertebral PSV cm/s 48 EDV cm/s 13 Antegrade  +---------+--------+--+--------+--+---------+   Summary: Right Carotid: Velocities in the right ICA are consistent with a 1-39% stenosis.  Left Carotid: Velocities in the left ICA are consistent with a 1-39% stenosis. Vertebrals: Bilateral vertebral arteries demonstrate antegrade flow. *See table(s) above for measurements and observations.  Electronically signed by Antony Contras MD on 10/03/2021 at 2:20:06 PM.    Final     PHYSICAL EXAM General:  Alert, obese elderly African-American lady female in no acute distress   NEURO:  Mental Status: AA&Ox3  Speech/Language: speech is with mild dysarthria  Cranial Nerves:  II: PERRL. Visual fields full.   III, IV, VI: EOMI. Eyelids elevate symmetrically.  V: Sensation is intact to light touch and symmetrical to face.  VII: Moderate right facial droop present VIII: hearing intact to voice. IX, X: Phonation is normal.  XII: right sided tongue deviation Motor: 5/5 strength to LUE and LLE, 4/5 to RUE, 4/5 to RLE with slight drift.  Mild right grip weakness and diminished fine in the movements on the right and orbits left over right upper extremity. Sensation- Intact to light touch bilaterally. Extinction absent to light touch to DSS.   Coordination:Right leg drift Gait- deferred  NIHSS 4 .Premorbid modified Rankin score 0  ASSESSMENT/PLAN [name] is a 76 y.o. female with history of DM2, HTN, HLD, CKD3 and GERD presenting with dysarthric speech and right sided facial droop.   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  TNK was not given and thrombectomy was not performed as patient presented outside the window.  She reports that her blood pressure is not well controlled at home.   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 Small vessel disease.  MRI  Acute infarct of left lentiform nucleus and corona radiata, progressive atrophy and white matter disease MRA  No LVO, intracranial atherosclerosis without significant  stenosis Carotid Doppler  1-39% stenosis of left and right carotids 2D Echo EF 55-60%, no atrial level shunt LDL 97 HgbA1c 7.2 VTE prophylaxis - lovenox    Diet   DIET DYS 2 Room service appropriate? Yes; Fluid consistency: Honey Thick   aspirin 81 mg daily prior to admission, now on aspirin 81 mg daily and clopidogrel 75 mg daily. For 3 weeks, then Plavix indefinitely Therapy recommendations:  CIR  Disposition:  pending  Hypertension Home meds:  lisinopril 40 mg daily Stable Permissive hypertension (OK if < 220/120) but gradually normalize in 5-7 days Long-term BP goal normotensive  Hyperlipidemia Home meds:  none LDL 97, goal < 70 Add atorvastatin 40 mg daily  Continue statin at discharge  Diabetes type II Uncontrolled Home meds:  insulin 70/30, 30 units BID HgbA1c 7.2, goal < 7.0 CBGs Recent Labs    10/03/21 0404 10/03/21 0742 10/03/21 1111  GLUCAP 127* 108* 127*    SSI  Other Stroke Risk Factors Advanced Age >/= 65  Obesity, There is no height or weight on file to calculate BMI., BMI >/= 30 associated with increased stroke risk, recommend weight loss, diet and exercise as appropriate    Other Active Problems CKD3 Avoid contrast when possible Renally dose medications as appropriate  Herring day # [place] , MSN, AGACNP-BC [facility] See Amion for schedule and pager information 10/03/2021 3:03 PM   STROKE MD NOTE : I have personally obtained history,examined this patient, reviewed notes, independently viewed imaging studies, participated in medical decision making and plan of care.ROS completed by me personally and pertinent positives fully documented  I have made any additions or clarifications directly to the above note. Agree with note above.  Patient presented with dysarthria and facial weakness due to left brain subcortical infarct likely from small vessel disease.  Recommend aspirin and Plavix for 3 weeks followed by Plavix  alone and aggressive risk factor modification.  Patient also appears to be at risk for sleep apnea and may benefit with consideration for participation in the sleep smart stroke prevention study.  She was given information to review and decide.  Long discussion with patient and her 2 daughters at the bedside and answered questions.  Greater than 50% time during this 50-minute prolonged visit was spent on counseling and coordination of care about lacunar stroke and discussion about stroke prevention and treatment risk factor modification and answering questions.  Antony Contras, MD Medical Director [facility] Pager: [phone] 10/03/2021 5:05 PM   To contact Stroke Continuity provider, please refer to [email or web address] After hours, contact General Neurology

## 2021-10-03 NOTE — Evaluation (Signed)
Speech Language Pathology Evaluation Patient Details Name: Tamara Herring MRN: [id number] DOB: [birth year] Today's Date: 10/03/2021 Time: 1884-1660 SLP Time Calculation (min) (ACUTE ONLY): 16 min  Problem List:  Patient Active Problem List   Diagnosis Date Noted   Cerebrovascular accident (CVA) (HCC)    Right sided weakness 10/02/2021   Past Medical History:  Past Medical History:  Diagnosis Date   Diabetes mellitus without complication (HCC)    Hypertension    Past Surgical History: No past surgical history on file. HPI:  Pt is a 76 yo female presenting with R sided weakness difficulty with speech. MRI positive for acute infarct involving the L lentiform nucleus and corona radiata. PMH includes:  HTN, HLD, T2DM, GERD, CKD3b   Assessment / Plan / Recommendation Clinical Impression  Pt presents with a significant dysarthria with intelligibility of speech impacted even at the word and phrase level. Articulation, resonance, and volume are all impacted. Pt also exhibits cognitive deficits that include decreased awareness, verbal problem solving, and comprehension of mildly complex information. She recalled 2/4 words during a delayed recall task despite being given multiple choice cues, and she could not complete simple addition calculations despite Max cues within functional verbal problem solving task. She shares that at baseline she is cognitively independent, and says several times during testing that she is "not thinking straight." She will benefit from SLP acutely and at next level of care.    SLP Assessment  SLP Recommendation/Assessment: Patient needs continued Speech Lanaguage Pathology Services SLP Visit Diagnosis: Dysarthria and anarthria (R47.1);Cognitive communication deficit (R41.841)    Recommendations for follow up therapy are one component of a multi-disciplinary discharge planning process, led by the attending physician.  Recommendations may be updated based on patient status,  additional functional criteria and insurance authorization.    Follow Up Recommendations  Acute inpatient rehab (3hours/day)    Assistance Recommended at Discharge  Intermittent Supervision/Assistance  Functional Status Assessment Patient has had a recent decline in their functional status and demonstrates the ability to make significant improvements in function in a reasonable and predictable amount of time.  Frequency and Duration min 2x/week  2 weeks      SLP Evaluation Cognition  Overall Cognitive Status: Impaired/Different from baseline Arousal/Alertness: Awake/alert Orientation Level: Oriented X4 Attention: Sustained Sustained Attention: Impaired Sustained Attention Impairment: Verbal basic Memory: Impaired Memory Impairment: Storage deficit;Retrieval deficit;Decreased recall of new information Awareness: Impaired Awareness Impairment: Intellectual impairment Problem Solving: Impaired Problem Solving Impairment: Verbal basic Safety/Judgment: Impaired       Comprehension  Auditory Comprehension Overall Auditory Comprehension: Impaired Yes/No Questions: Impaired Complex Questions: 50-74% accurate Commands: Within Functional Limits (one-step) Conversation: Simple    Expression Expression Primary Mode of Expression: Verbal Verbal Expression Overall Verbal Expression: Appears within functional limits for tasks assessed Written Expression Dominant Hand: Right   Oral / Motor  Oral Motor/Sensory Function Overall Oral Motor/Sensory Function: Severe impairment Facial ROM: Reduced right;Suspected CN VII (facial) dysfunction Facial Symmetry: Abnormal symmetry right;Suspected CN VII (facial) dysfunction Facial Strength: Reduced right;Suspected CN VII (facial) dysfunction Facial Sensation: Within Functional Limits Lingual ROM: Within Functional Limits Lingual Symmetry: Within Functional Limits Lingual Strength: Within Functional Limits Velum: Within Functional  Limits Mandible: Within Functional Limits Motor Speech Overall Motor Speech: Impaired Respiration: Impaired Level of Impairment: Phrase Resonance: Hypernasality Articulation: Impaired Level of Impairment: Word Intelligibility: Intelligibility reduced Word: 75-100% accurate Phrase: 50-74% accurate            Mahala Menghini., M.A. CCC-SLP Acute Herbalist [phone] Office [phone]  10/03/2021, 2:57 PM

## 2021-10-03 NOTE — Evaluation (Signed)
Occupational Therapy Evaluation Patient Details Name: Tamara FuchsMary Herring MRN: [id number] DOB: 02/17/1946 Today's Date: 10/03/2021   History of Present Illness 76 y.o. female who presents with dysarthric speech. R facial droop and mild right upper & lower extremity weakness. 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  PMH significant for DM2, HTN, HLD, CKD3b, GERD.   Clinical Impression   PTA pt lives independently with her son; family assists with driving and IADL tasks as indicated below. Requires Mod A +2 for mobility and ADL tasks due to deficits listed below.  At this time recommend intensive rehab at [facility] to maximize functional level of independence and facilitate safe DC home with assistance of family. Acute OT to follow.      Recommendations for follow up therapy are one component of a multi-disciplinary discharge planning process, led by the attending physician.  Recommendations may be updated based on patient status, additional functional criteria and insurance authorization.   Follow Up Recommendations  Acute inpatient rehab (3hours/day)    Assistance Recommended at Discharge Frequent or constant Supervision/Assistance  Patient can return home with the following      Functional Status Assessment  Patient has had a recent decline in their functional status and demonstrates the ability to make significant improvements in function in a reasonable and predictable amount of time.  Equipment Recommendations  BSC/3in1    Recommendations for Other Services Rehab consult     Precautions / Restrictions Precautions Precautions: Fall Precaution Comments: permissive HTN <220 Restrictions Weight Bearing Restrictions: No      Mobility Bed Mobility Overal bed mobility: Needs Assistance Bed Mobility: Supine to Sit;Sit to Supine     Supine to sit: Mod assist;+2 for physical assistance Sit to supine: Mod assist;+2 for physical assistance        Transfers Overall  transfer level: Needs assistance Equipment used: 2 person hand held assist Transfers: Sit to/from Stand Sit to Stand: Mod assist;+2 physical assistance           General transfer comment: Ableto side step toward weak R side  - required physical assistance tomove RLE laterally      Balance Overall balance assessment: Needs assistance   Sitting balance-Leahy Scale: Fair       Standing balance-Leahy Scale: Poor                             ADL either performed or assessed with clinical judgement   ADL Overall ADL's : Needs assistance/impaired Eating/Feeding: Supervision/ safety;Set up   Grooming: Moderate assistance   Upper Body Bathing: Moderate assistance   Lower Body Bathing: Moderate assistance   Upper Body Dressing : Moderate assistance   Lower Body Dressing: Sit to/from stand;Maximal assistance   Toilet Transfer: Moderate assistance;+2 for physical assistance   Toileting- Clothing Manipulation and Hygiene: Maximal assistance       Functional mobility during ADLs: Moderate assistance       Vision Baseline Vision/History: 1 Wears glasses Vision Assessment?: Vision impaired- to be further tested in functional context Additional Comments: Will further assess; appears to prefer L gaze, however watching TV in R gaze; Decreased speed of saccades; will further assess     Perception Perception Comments: appeasr to demonstrate R inattention; will assess   Praxis      Pertinent Vitals/Pain Pain Assessment: No/denies pain     Hand Dominance Right   Extremity/Trunk Assessment Upper Extremity Assessment Upper Extremity Assessment: RUE deficits/detail RUE Deficits / Details:  isolated movement out of synergy pattern; able to touch hand to mouth with min A; gross grasp/release; beginning to demonstrate isolated finger movement; @ 30 degrees active shoulder flexion; hikes shoulder; difficulty using functionally at this time RUE Sensation: decreased light  touch RUE Coordination: decreased fine motor;decreased gross motor   Lower Extremity Assessment Lower Extremity Assessment: Defer to PT evaluation   Cervical / Trunk Assessment Cervical / Trunk Assessment: Other exceptions (able to maintain midline postural control EOB)   Communication Communication Communication: Expressive difficulties   Cognition Arousal/Alertness: Awake/alert Behavior During Therapy: Flat affect Overall Cognitive Status: Impaired/Different from baseline Area of Impairment: Attention;Safety/judgement;Awareness;Problem solving                   Current Attention Level: Sustained     Safety/Judgement: Decreased awareness of safety;Decreased awareness of deficits Awareness: Emergent Problem Solving: Slow processing General Comments: difficulty attending to R at times     General Comments  R facial droop    Exercises     Shoulder Instructions      Home Living Family/patient expects to be discharged to:: Private residence Living Arrangements: Children (son) Available Help at Discharge: Family;Available 24 hours/day Type of Home: House Home Access: Stairs to enter [employer] of Steps: 2 Entrance Stairs-Rails: Right Home Layout: One level     Bathroom Shower/Tub: Chief Strategy Officer: Standard Bathroom Accessibility: Yes How Accessible: Accessible via walker Home Equipment: None      Lives With: Son    Prior Functioning/Environment Prior Level of Function : Independent/Modified Independent               ADLs Comments: cooks/cleans; does not drive; daughter assists with financial managment and drives        OT Problem List: Decreased strength;Decreased range of motion;Decreased activity tolerance;Impaired balance (sitting and/or standing);Impaired vision/perception;Decreased coordination;Decreased cognition;Decreased safety awareness;Decreased knowledge of use of DME or AE;Decreased knowledge of  precautions;Impaired sensation;Impaired tone;Obesity;Impaired UE functional use;Increased edema      OT Treatment/Interventions: Self-care/ADL training;Therapeutic exercise;Neuromuscular education;DME and/or AE instruction;Therapeutic activities;Cognitive remediation/compensation;Visual/perceptual remediation/compensation;Patient/family education;Balance training    OT Goals(Current goals can be found in the care plan section) Acute Rehab OT Goals Patient Stated Goal: to get better and be able to care for herself OT Goal Formulation: With patient/family Time For Goal Achievement: 10/17/21 Potential to Achieve Goals: Good  OT Frequency: Min 2X/week    Co-evaluation              AM-PAC OT "6 Clicks" Daily Activity     Outcome Measure Help from another person eating meals?: A Little Help from another person taking care of personal grooming?: A Lot Help from another person toileting, which includes using toliet, bedpan, or urinal?: A Lot Help from another person bathing (including washing, rinsing, drying)?: A Lot Help from another person to put on and taking off regular upper body clothing?: A Lot Help from another person to put on and taking off regular lower body clothing?: A Lot 6 Click Score: 13   End of Session Equipment Utilized During Treatment: Gait belt Nurse Communication: Mobility status  Activity Tolerance: Patient tolerated treatment well Patient left: in bed;with call bell/phone within reach;with family/visitor present  OT Visit Diagnosis: Unsteadiness on feet (R26.81);Other abnormalities of gait and mobility (R26.89);Muscle weakness (generalized) (M62.81);Low vision, both eyes (H54.2);Other symptoms and signs involving cognitive function;Other symptoms and signs involving the nervous system (R29.898);Hemiplegia and hemiparesis Hemiplegia - Right/Left: Right Hemiplegia - dominant/non-dominant: Dominant Hemiplegia - caused by: Cerebral infarction  Time:  0865-7846 OT Time Calculation (min): 16 min Charges:  OT General Charges $OT Visit: 1 Visit OT Evaluation $OT Eval Moderate Complexity: 1 Mod  Lisia Westbay, OT/L   Acute OT Clinical Specialist Acute Rehabilitation Services Pager [phone] Office [phone]   [facility] 10/03/2021, 2:19 PM

## 2021-10-03 NOTE — Progress Notes (Signed)
FPTS Brief Progress Note  S: Patient was laying down in bed watching TV upon arrival.  She reports she is not feeling any  better than yesterday.  Patient reports that her right lower extremity feels heavier and she is having more difficult moving the right leg. Patient has no concern about the other extremities, she said they are unchanged. She denies having any pain on the right lower extremity. Patient endorses overall fatigue but denies any headache, change in vision or shortness of breath.   O: BP (!) 195/82 (BP Location: Left Arm)    Pulse 88    Temp 98.6 F (37 C) (Oral)    Resp 18    SpO2 97%    General:Awake, well appearing, NAD HEENT: Atraumatic, EOMI CV: RRR, no murmurs, normal S1/S2 Pulm: CTAB, good WOB on RA, no crackles Abd: Soft, no distension, no tenderness Skin: dry, warm Ext: 3/5 strength on LUE and LLE, 4/5 muscle strength on RUE and RLE.  Sensation intact Neuro: Right facial droop present, dysarthria, oriented x3, CN II-XII intact    A/P:  CVA PT recommend CIR for patient. -Neuro following, appreciate recs -Continue PT/OT -Continue plan per day team.  HTN Last BP 195/82. Permissive hypertension goal <220/120 per neurology recommendation.   - Orders reviewed. Labs for AM ordered, which was adjusted as needed.    Jerre Simon, MD 10/03/2021, 8:24 PM PGY-1, [facility] Night Resident  Please page [phone] with questions.

## 2021-10-03 NOTE — Progress Notes (Signed)
Modified Barium Swallow Progress Note  Patient Details  Name: Tamara Herring MRN: [id number] Date of Birth: 1946-07-13  Today's Date: 10/03/2021  Modified Barium Swallow completed.  Full report located under Chart Review in the Imaging Section.  Brief recommendations include the following:  Clinical Impression  Pt has an oropharyngeal dysphagia with oral weakness and reduced coordination. She has reduced lingual manipulation contributing to slow posterior transit and lingual residue. Weak labial seal allows all liquids to escape from the R side of her oral cavity. Reduced coordination of bolus transit results in inconsistent timing for swallow trigger with thin and nectar thick liquids. She has reduced epiglottic inversion that is in part related to suspected cervical osteophyte (C3-C4, MD not present to confirm), and when parts of the bolus spills beyond the valleculae before the swallow, it is able to enter the airway. Aspiration is trace but silent. Attempted to use strategies, including chin tuck, to increase containment above the valleculae, but she has trouble implementing strategies in a consistent manner. She has slower transit with honey thick liquids and solids, but this gives her more time to coordinate her swallowing. Given limitations with mastication as well, will start with Dys 2 (finely chopped) diet and honey thick liquids.   Swallow Evaluation Recommendations       SLP Diet Recommendations: Dysphagia 2 (Fine chop) solids;Honey thick liquids   Liquid Administration via: Cup;Straw   Medication Administration: Crushed with puree   Supervision: Staff to assist with self feeding;Full supervision/cueing for compensatory strategies   Compensations: Slow rate;Small sips/bites;Monitor for anterior loss;Lingual sweep for clearance of pocketing   Postural Changes: Seated upright at 90 degrees   Oral Care Recommendations: Oral care BID        Mahala Menghini., M.A. CCC-SLP Acute  Rehabilitation Services Pager [phone] Office [phone]  10/03/2021,4:00 PM

## 2021-10-03 NOTE — Progress Notes (Signed)
PT Cancellation Note  Patient Details Name: Tamara Herring MRN: [id number] DOB: [birth year]   Cancelled Treatment:    Reason Eval/Treat Not Completed: Patient at procedure or test/unavailable Pt currently having echo. Will follow up as schedule allows.   Cindee Salt, DPT  Acute Rehabilitation Services  Pager: [phone] Office: [phone]    [name] 10/03/2021, 11:37 AM

## 2021-10-03 NOTE — Progress Notes (Signed)
Inpatient Rehab Admissions Coordinator:  ? ?Per therapy recommendations,  patient was screened for [facility] candidacy by Santiaga Butzin, MS, CCC-SLP. At this time, Pt. Appears to be a a potential candidate for [facility]. I will place   order for rehab consult per protocol for full assessment. Please contact me any with questions. ? ?Keshanna Riso, MS, CCC-SLP ?Rehab Admissions Coordinator  ?[phone] (celll) ?[phone] (office) ? ?

## 2021-10-03 NOTE — Progress Notes (Signed)
Carotid duplex has been completed.   Preliminary results in CV Proc.   Aundra Millet Anothony Bursch 10/03/2021 10:37 AM

## 2021-10-03 NOTE — Progress Notes (Addendum)
Family Medicine Teaching Service Daily Progress Note Intern Pager: [phone]  Patient name: Tamara Herring Medical record number: [id number] Date of birth: 04/26/1946 Age: 76 y.o. Gender: female  Primary Care Provider: [name], FNP Consultants: None Code Status: DNR  Pt Overview and Major Events to Date:  1/2- Admitted  Assessment and Plan: Tamara Herring is a 76 y.o. female presenting with bilateral upper and lower extremity weakness and difficulty with speech. PMH is significant for HTN, HLD, T2DM, GERD, CKD3b, and questionable history of CAD w/ MI.   CVA  Patient presented with new onset right-sided facial droop, bilateral upper and lower extremity weakness which are persistent on exam today. Patient is alert and oriented. She is able to recount the events leading up to the stroke and events there after. CT head unremarkable. MRI acute infarct L lentiform nucleus and corona radiata. MRA negative. Failed bedside swallow, SLP to evaluate.  - Neuro consulted, appreciate recommendations -Cardiac monitoring x 24 hours with continuous pulse ox -Vital signs per unit -PT/OT eval and treat -SLP consult -BMP daily -f/u echo  -Permissive HTN for 24 hours - ASA 81 daily + plavix 75 mg daily for 21 days; followed by ASA alone.   HTN BP 178/81. Home medications include lisinopril 40mg  daily, Amlodipine 10mg  daily, metoprolol 25mg  (?). Medication list is unclear at this time and patient unable to verify.  -Permissive HTN for 24 hours -Add back home medication when appropriate and following med rec    HLD   CAD Home medications include atorvastatin 20mg  daily, pravastatin 40mg  daily and ASA 81mg  daily. Most recent lipid profile here LDL 97, HDL 53. Holding medications due to reason above. Questionable upon charting whether or not patient has history of MI with CAD. Cardiology report on 09/18/2018 for stress echo notes global LVEF >50% without regional wall motion abnormalities, no evidence  of resting ischemia or old infarct.  -Start statin when able to take PO   T2DM CBG am 108. Hgb 7.2 -CBG monitoring q4h -Sensitive SSI   CKD IIIb Cr at baseline.  -Daily BMP   Anemia Hgb stable. Home medications include ferrous sulfate 325mg  daily. Likely iron-deficient anemia.  -Hold medications while NPO   GERD Home medications include omeprazole 40mg  daily. -hold until completed med rec   FEN/GI: NPO, awaiting bedside swallow Prophylaxis: Lovenox   Subjective:  Able to recount the events leading up to the stroke. Denies any pain at this time. Endorses right sided weakness.   Objective: Temp:  [98.4 F (36.9 C)-98.7 F (37.1 C)] 98.4 F (36.9 C) (01/02 1325) Pulse Rate:  [69-86] 77 (01/03 0550) Resp:  [14-25] 23 (01/03 0550) BP: (116-204)/(71-104) 178/81 (01/03 0550) SpO2:  [94 %-100 %] 98 % (01/03 0550) Physical Exam: General: Appears well, no acute distress. Age appropriate. Cardiac: RRR, normal heart sounds, no murmurs Respiratory: CTAB, normal effort Abdomen: soft, nontender, nondistended Extremities: No edema or cyanosis. Skin: Warm and dry, no rashes noted Neuro: alert and oriented.  CN deficits:  CVII: Right sided facial droop LUE and LLE strength 5/5; RUE+RLE 4/5 strength Psych: tearful when recounting events   Laboratory: Recent Labs  Lab 10/02/21 1151 10/02/21 1159  WBC 7.6  --   HGB 11.8* 12.9  HCT 36.8 38.0  PLT 251  --    Recent Labs  Lab 10/02/21 1151 10/02/21 1159  NA 135 138  K 4.0 4.0  CL 103 105  CO2 21*  --   BUN 16 17  CREATININE 1.56* 1.50*  CALCIUM 8.8*  --  PROT 7.6  --   BILITOT 0.8  --   ALKPHOS 77  --   ALT 15  --   AST 20  --   GLUCOSE 137* 132*    Imaging/Diagnostic Tests: MRA HEAD WITHOUT CONTRAST   TECHNIQUE: Angiographic images of the Circle of Willis were acquired using MRA technique without intravenous contrast.   COMPARISON:  Brain MRI 10/02/2021, 08/02/2019  IMPRESSION: Negative intracranial  MRA for age. No large vessel occlusion. Intracranial atherosclerosis with no significant stenosis.  MRI HEAD WITHOUT AND WITH CONTRAST  COMPARISON:  CT head without contrast 10/02/2021. MR head without contrast 10/11/2018   IMPRESSION: 1. Acute nonhemorrhagic infarct involving the left lentiform nucleus and corona radiata. This likely involves cortical spinal tracts. 2. Progressive atrophy and white matter disease. This likely reflects the sequela of chronic microvascular ischemia. 3. Chronic right maxillary sinus occlusion. 4. Bilateral mastoid effusions. No obstructing nasopharyngeal lesion is present.    Lavonda Jumbo, DO 10/03/2021, 7:23 AM PGY-3, [facility] FPTS Intern pager: [phone], text pages welcome

## 2021-10-03 NOTE — ED Notes (Signed)
Pt failed swallow screen, so will not be able to take any PO meds- this includes her plavix- until speech eval is done.

## 2021-10-03 NOTE — Evaluation (Signed)
Clinical/Bedside Swallow Evaluation Patient Details  Name: Tamara Herring MRN: [id number] Date of Birth: [birth year]  Today's Date: 10/03/2021 Time: SLP Start Time (ACUTE ONLY): 1031 SLP Stop Time (ACUTE ONLY): 1045 SLP Time Calculation (min) (ACUTE ONLY): 14 min  Past Medical History:  Past Medical History:  Diagnosis Date   Diabetes mellitus without complication (HCC)    Hypertension    Past Surgical History: No past surgical history on file. HPI:  Pt is a 76 yo female presenting with R sided weakness difficulty with speech. MRI positive for acute infarct involving the L lentiform nucleus and corona radiata. PMH includes:  HTN, HLD, T2DM, GERD, CKD3b    Assessment / Plan / Recommendation  Clinical Impression  Pt presents with significant R sided facial weakness (CN VII) that impacts labial seal, resulting in anterior loss, of which she does show awareness. She responds appropriately to sensory testing but she does have evidence of potentially biting the inside of her lip. She has impaired mastication of a small bite of graham cracker and says that she cannot clear it from her mouth, facilitated by a bolus of puree to add moisture. Pt had immediate coughing with thin liquids when resuming liquids after solid trials. Recommend proceeding with MBS to better evaluate oropharyngeal swallowing. In the meantime, would offer meds crushed in puree. SLP Visit Diagnosis: Dysphagia, unspecified (R13.10)    Aspiration Risk  Moderate aspiration risk    Diet Recommendation NPO except meds   Medication Administration: Crushed with puree    Other  Recommendations Oral Care Recommendations: Oral care QID    Recommendations for follow up therapy are one component of a multi-disciplinary discharge planning process, led by the attending physician.  Recommendations may be updated based on patient status, additional functional criteria and insurance authorization.  Follow up Recommendations  (tba)       Assistance Recommended at Discharge    Functional Status Assessment    Frequency and Duration            Prognosis Prognosis for Safe Diet Advancement: Good      Swallow Study   General HPI: Pt is a 76 yo female presenting with R sided weakness difficulty with speech. MRI positive for acute infarct involving the L lentiform nucleus and corona radiata. PMH includes:  HTN, HLD, T2DM, GERD, CKD3b Type of Study: Bedside Swallow Evaluation Previous Swallow Assessment: none in chart Diet Prior to this Study: NPO Temperature Spikes Noted: No Respiratory Status: Room air History of Recent Intubation: No Behavior/Cognition: Cooperative;Alert;Pleasant mood;Requires cueing Oral Cavity Assessment: Other (comment) (red spot on R inner lip, pt says she bit her lip) Oral Care Completed by SLP: No Oral Cavity - Dentition: Adequate natural dentition Vision: Functional for self-feeding Self-Feeding Abilities: Needs assist Patient Positioning: Upright in bed Baseline Vocal Quality: Normal Volitional Cough: Strong Volitional Swallow: Able to elicit    Oral/Motor/Sensory Function Overall Oral Motor/Sensory Function: Severe impairment Facial ROM: Reduced right;Suspected CN VII (facial) dysfunction Facial Symmetry: Abnormal symmetry right;Suspected CN VII (facial) dysfunction Facial Strength: Reduced right;Suspected CN VII (facial) dysfunction Facial Sensation: Within Functional Limits Lingual ROM: Within Functional Limits Lingual Symmetry: Within Functional Limits Lingual Strength: Within Functional Limits Velum: Within Functional Limits Mandible: Within Functional Limits   Ice Chips Ice chips: Not tested   Thin Liquid Thin Liquid: Impaired Presentation: Cup;Self Fed;Straw Oral Phase Impairments: Reduced labial seal Oral Phase Functional Implications: Right anterior spillage Pharyngeal  Phase Impairments: Cough - Immediate    Nectar Thick Nectar Thick Liquid: Not tested  Honey Thick  Honey Thick Liquid: Not tested   Puree Puree: Impaired Presentation: Spoon Oral Phase Impairments: Reduced labial seal Oral Phase Functional Implications: Right anterior spillage   Solid     Solid: Impaired Oral Phase Impairments: Impaired mastication;Reduced labial seal Oral Phase Functional Implications: Prolonged oral transit ("can't swallow")      Mahala Menghini., M.A. CCC-SLP Acute Rehabilitation Services Pager [phone] Office [phone]  10/03/2021,11:13 AM

## 2021-10-03 NOTE — Progress Notes (Signed)
Physical Therapy Evaluation   10/03/21 1515  PT Visit Information  Last PT Received On 10/03/21  Assistance Needed +2  PT/OT/SLP Co-Evaluation/Treatment Yes  Reason for Co-Treatment For patient/therapist safety;To address functional/ADL transfers  PT goals addressed during session Mobility/safety with mobility;Balance  History of Present Illness 76 y.o. female who presents with dysarthric speech. R facial droop and mild right upper & lower extremity weakness. 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  PMH significant for DM2, HTN, HLD, CKD3b, GERD.  Precautions  Precautions Fall  Precaution Comments permissive HTN <220  Restrictions  Weight Bearing Restrictions No  Home Living  Family/patient expects to be discharged to: Private residence  Living Arrangements Children (son)  Available Help at Discharge Family;Available 24 hours/day  Type of Home House  Home Access Stairs to enter  Entrance Stairs-Number of Steps 2  Entrance Stairs-Rails Right  Home Layout One level  Bathroom Nurse, children's Yes  Home Equipment None   Lives With Son  Prior Function  Prior Level of Function  Independent/Modified Independent  ADLs Comments cooks/cleans; does not drive; daughter assists with financial managment and drives  Communication  Communication Expressive difficulties  Pain Assessment  Pain Assessment No/denies pain  Cognition  Arousal/Alertness Awake/alert  Behavior During Therapy Flat affect  Overall Cognitive Status Impaired/Different from baseline  Area of Impairment Attention;Safety/judgement;Awareness;Problem solving  Current Attention Level Sustained  Safety/Judgement Decreased awareness of safety;Decreased awareness of deficits  Awareness Emergent  Problem Solving Slow processing  General Comments difficulty attending to R at times  Upper Extremity Assessment  Upper Extremity Assessment Defer to OT  evaluation  Lower Extremity Assessment  Lower Extremity Assessment RLE deficits/detail  RLE Deficits / Details Grossly 2/5 throughout RLE. Pt reports sensation in tact  Cervical / Trunk Assessment  Cervical / Trunk Assessment Other exceptions (able to maintain midline postural control EOB)  Bed Mobility  Overal bed mobility Needs Assistance  Bed Mobility Supine to Sit;Sit to Supine  Supine to sit Mod assist;+2 for physical assistance  Sit to supine Mod assist;+2 for physical assistance  General bed mobility comments Assist for trunk and LEs  Transfers  Overall transfer level Needs assistance  Equipment used 2 person hand held assist  Transfers Sit to/from Stand  Sit to Stand Mod assist;+2 physical assistance  General transfer comment Able to side step toward weak R side  - required physical assistance tomove RLE laterally  Modified Rankin (Stroke Patients Only)  Pre-Morbid Rankin Score 0  Modified Rankin 4  Balance  Overall balance assessment Needs assistance  Sitting-balance support No upper extremity supported;Feet supported  Sitting balance-Leahy Scale Fair  Standing balance support Bilateral upper extremity supported  Standing balance-Leahy Scale Poor  Standing balance comment Reliant on BUE and external support  General Comments  General comments (skin integrity, edema, etc.) R facial droop  PT - End of Session  Equipment Utilized During Treatment Gait belt  Activity Tolerance Patient tolerated treatment well  Patient left in bed;with call bell/phone within reach;with family/visitor present (on stretcher in ED)  Nurse Communication Mobility status  PT Assessment  PT Recommendation/Assessment Patient needs continued PT services  PT Visit Diagnosis Unsteadiness on feet (R26.81);Muscle weakness (generalized) (M62.81);Difficulty in walking, not elsewhere classified (R26.2);Hemiplegia and hemiparesis  Hemiplegia - Right/Left Right  Hemiplegia - dominant/non-dominant Dominant   Hemiplegia - caused by Cerebral infarction  PT Problem List Decreased strength;Decreased activity tolerance;Decreased balance;Decreased mobility;Decreased knowledge of use of DME;Decreased safety awareness;Decreased knowledge of  precautions  PT Plan  PT Frequency (ACUTE ONLY) Min 4X/week  PT Treatment/Interventions (ACUTE ONLY) DME instruction;Gait training;Functional mobility training;Therapeutic activities;Therapeutic exercise;Balance training;Patient/family education  AM-PAC PT "6 Clicks" Mobility Outcome Measure (Version 2)  Help needed turning from your back to your side while in a flat bed without using bedrails? 2  Help needed moving from lying on your back to sitting on the side of a flat bed without using bedrails? 1  Help needed moving to and from a bed to a chair (including a wheelchair)? 1  Help needed standing up from a chair using your arms (e.g., wheelchair or bedside chair)? 1  Help needed to walk in hospital room? 1  Help needed climbing 3-5 steps with a railing?  1  6 Click Score 7  Consider Recommendation of Discharge To: CIR/SNF/LTACH  Progressive Mobility  What is the highest level of mobility based on the progressive mobility assessment? Level 3 (Stands with assist) - Balance while standing  and cannot march in place  Mobility Out of bed for toileting;Out of bed to chair with meals  PT Recommendation  Follow Up Recommendations Acute inpatient rehab (3hours/day)  Assistance recommended at discharge Frequent or constant Supervision/Assistance  Patient can return home with the following Two people to help with walking and/or transfers;Two people to help with bathing/dressing/bathroom  Functional Status Assessment Patient has had a recent decline in their functional status and demonstrates the ability to make significant improvements in function in a reasonable and predictable amount of time.  PT equipment Rolling walker (2 wheels);Wheelchair cushion (measurements  PT);Wheelchair (measurements PT)  Individuals Consulted  Consulted and Agree with Results and Recommendations Patient;Family member/caregiver  Family Member Consulted daughter  Acute Rehab PT Goals  Patient Stated Goal to be independent  PT Goal Formulation With patient  Time For Goal Achievement 10/17/21  Potential to Achieve Goals Good  PT Time Calculation  PT Start Time (ACUTE ONLY) 1217  PT Stop Time (ACUTE ONLY) 1233  PT Time Calculation (min) (ACUTE ONLY) 16 min  PT General Charges  $$ ACUTE PT VISIT 1 Visit  PT Evaluation  $PT Eval Moderate Complexity 1 Mod  Written Expression  Dominant Hand Right   Pt admitted secondary to problem above with deficits below. Pt requiring mod A +2 for mobility tasks this session. WEakness noted in RLE and required assist with moving RLE when taking steps at EOB. Pt was previously independent prior to admission. Recommending acute inpatient rehab level therapies at d/c. Will continue to follow acutely.   Farley Ly, PT, DPT  Acute Rehabilitation Services  Pager: [phone] Office: [phone]

## 2021-10-04 DIAGNOSIS — I6330 Cerebral infarction due to thrombosis of unspecified cerebral artery: Secondary | ICD-10-CM | POA: Insufficient documentation

## 2021-10-04 LAB — GLUCOSE, CAPILLARY
Glucose-Capillary: 147 mg/dL — ABNORMAL HIGH (ref 70–99)
Glucose-Capillary: 127 mg/dL — ABNORMAL HIGH (ref 70–99)
Glucose-Capillary: 165 mg/dL — ABNORMAL HIGH (ref 70–99)
Glucose-Capillary: 143 mg/dL — ABNORMAL HIGH (ref 70–99)
Glucose-Capillary: 155 mg/dL — ABNORMAL HIGH (ref 70–99)

## 2021-10-04 MED ORDER — LOSARTAN POTASSIUM 50 MG PO TABS: 100.0000 mg | ORAL_TABLET | Freq: Every day | ORAL | Status: DC

## 2021-10-04 MED ORDER — INSULIN ASPART 100 UNIT/ML IJ SOLN: 0.0000 [IU] | Freq: Every day | INTRAMUSCULAR | Status: DC

## 2021-10-04 MED ORDER — LOSARTAN POTASSIUM 50 MG PO TABS
100.0000 mg | ORAL_TABLET | Freq: Every day | ORAL | Status: DC
  Administered 2021-10-05 – 2021-10-06 (×2): 100 mg via ORAL
  Filled 2021-10-04 (×2): qty 2

## 2021-10-04 MED ORDER — LISINOPRIL 20 MG PO TABS
40.0000 mg | ORAL_TABLET | Freq: Every day | ORAL | Status: DC
  Administered 2021-10-04: 40 mg via ORAL
  Filled 2021-10-04: qty 2

## 2021-10-04 MED ORDER — INSULIN ASPART 100 UNIT/ML IJ SOLN
0.0000 [IU] | Freq: Three times a day (TID) | INTRAMUSCULAR | Status: DC
  Administered 2021-10-05 (×2): 2 [IU] via SUBCUTANEOUS
  Administered 2021-10-05 – 2021-10-06 (×2): 1 [IU] via SUBCUTANEOUS
  Administered 2021-10-06: 2 [IU] via SUBCUTANEOUS

## 2021-10-04 NOTE — Progress Notes (Signed)
PT Cancellation Note  Patient Details Name: Tamara Herring MRN: [id number] DOB: Apr 25, 1946   Cancelled Treatment:    Reason Eval/Treat Not Completed: Other (comment).  Declining therapy today, even bedside visit.  Follow up as time and pt allow.   Ivar Drape 10/04/2021, 4:43 PM  Samul Dada, PT PhD Acute Rehab Dept. Number: [facility] [id number] and [facility] [phone]

## 2021-10-04 NOTE — Progress Notes (Addendum)
Pt BP is high (See flowsheet), Dr Dahbura notified, Pt asymptomatic. Will continue to monitor pt.  °

## 2021-10-04 NOTE — Progress Notes (Signed)
FPTS Brief Progress Note  S: Ms. Bonus was laying down awake in bed on arrival.  She endorses feeling down because of numbness on her right hand and feeling of heaviness of the right lower extremity which she report is same as yesterday.  O: BP (!) 188/91 (BP Location: Left Arm)    Pulse 83    Temp 98.1 F (36.7 C) (Oral)    Resp 17    SpO2 97%    General:Awake, well appearing, NAD HEENT: Atraumatic, MMM CV: RRR, no murmurs, normal S1/S2 Pulm: CTAB, good WOB on RA,  Abd: Soft, no distension, no tenderness Skin: dry, warm Ext: 3/5 strength on LUE and LLE, 4/5 strength on RUE and RLE. Neuro: Severe right face droop, dysarthria  A/P: -Continue plan per day team - Orders reviewed. Labs for AM ordered, which was adjusted as needed.    Alen Bleacher, MD 10/04/2021, 8:43 PM PGY-1, [place] Night Resident  Please page [phone] with questions.

## 2021-10-04 NOTE — Progress Notes (Signed)
Inpatient Rehabilitation Admissions Coordinator   I met at bedside with patient and then contacted two daughters, Fabio Pierce and Silva Bandy by phone. I discussed goals and expectations of a possible Cir admit. Pt's son, Coralyn Mark, does not work, but Airline pilot to contact her siblings to discuss caregiver supports for a possible Cir admit. I  begin [employer] fast streamer.  Danne Baxter, RN, MSN Rehab Admissions Coordinator [phone] 10/04/2021 12:39 PM

## 2021-10-04 NOTE — Progress Notes (Signed)
STROKE TEAM PROGRESS NOTE   INTERVAL HISTORY Patient is seen in her room with no one at the bedside.  Neurological exam is unchanged.  Vital signs are stable.  Patient did sign consent and is participating in the sleep smart stroke prevention study.  She tested positive for sleep apnea on the overnight NOx 3 monitor and will undergo CPAP mask tolerability trial tonight Vitals:   10/04/21 0355 10/04/21 0742 10/04/21 1137 10/04/21 1146  BP: (!) 185/95 (!) 170/89 (!) 193/101 (!) 198/77  Pulse: 86 89 83 79  Resp: 19 18 20    Temp: 98.3 F (36.8 C) 98.4 F (36.9 C) 98.8 F (37.1 C)   TempSrc: Oral Oral Oral   SpO2: 100% 98% 99%    CBC:  Recent Labs  Lab 10/02/21 1151 10/02/21 1159  WBC 7.6  --   NEUTROABS 4.3  --   HGB 11.8* 12.9  HCT 36.8 38.0  MCV 82.7  --   PLT 251  --    Basic Metabolic Panel:  Recent Labs  Lab 10/02/21 1151 10/02/21 1159 10/03/21 1700  NA 135 138 136  K 4.0 4.0 3.9  CL 103 105 103  CO2 21*  --  21*  GLUCOSE 137* 132* 144*  BUN 16 17 21   CREATININE 1.56* 1.50* 1.68*  CALCIUM 8.8*  --  8.9   Lipid Panel:  Recent Labs  Lab 10/02/21 1701  CHOL 168  TRIG 89  HDL 53  CHOLHDL 3.2  VLDL 18  LDLCALC 97   HgbA1c:  Recent Labs  Lab 10/02/21 1701  HGBA1C 7.2*   Urine Drug Screen: No results for input(s): LABOPIA, COCAINSCRNUR, LABBENZ, AMPHETMU, THCU, LABBARB in the last 168 hours.  Alcohol Level No results for input(s): ETH in the last 168 hours.  IMAGING past 24 hours No results found.  PHYSICAL EXAM General:  Alert, obese elderly African-American lady female in no acute distress   NEURO:  Mental Status: AA&Ox3  Speech/Language: speech is with mild dysarthria  Cranial Nerves:  II: PERRL. Visual fields full.   III, IV, VI: EOMI. Eyelids elevate symmetrically.  V: Sensation is intact to light touch and symmetrical to face.  VII: Moderate right facial droop present VIII: hearing intact to voice. IX, X: Phonation is normal.  XII:  right sided tongue deviation Motor: 5/5 strength to LUE and LLE, 4/5 to RUE, 4/5 to RLE with slight drift.  Mild right grip weakness and diminished fine in the movements on the right and orbits left over right upper extremity. Sensation- Intact to light touch bilaterally. Extinction absent to light touch to DSS.   Coordination:Right leg drift Gait- deferred     ASSESSMENT/PLAN Ms. Nylasia Bertolini is a 76 y.o. female with history of DM2, HTN, HLD, CKD3 and GERD presenting with dysarthric speech and right sided facial droop.   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  TNK was not given and thrombectomy was not performed as patient presented outside the window.  She reports that her blood pressure is not well controlled at home.   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 Small vessel disease.  MRI  Acute infarct of left lentiform nucleus and corona radiata, progressive atrophy and white matter disease MRA  No LVO, intracranial atherosclerosis without significant stenosis Carotid Doppler  1-39% stenosis of left and right carotids 2D  Echo EF 55-60%, no atrial level shunt LDL 97 HgbA1c 7.2 VTE prophylaxis - lovenox    Diet   DIET DYS 2 Room service appropriate? Yes; Fluid consistency: Honey Thick   aspirin 81 mg daily prior to admission, now on aspirin 81 mg daily and clopidogrel 75 mg daily. For 3 weeks, then Plavix indefinitely Therapy recommendations:  CIR  Disposition:  pending  Hypertension Home meds:  lisinopril 40 mg daily Stable Permissive hypertension (OK if < 220/120) but gradually normalize in 5-7 days Long-term BP goal normotensive  Hyperlipidemia Home meds:  none LDL 97, goal < 70 Add atorvastatin 40 mg daily  Continue statin at discharge  Diabetes type II Uncontrolled Home meds:  insulin 70/30, 30  units BID HgbA1c 7.2, goal < 7.0 CBGs Recent Labs    10/04/21 0348 10/04/21 0808 10/04/21 1140  GLUCAP 147* 127* 165*    SSI  Other Stroke Risk Factors Advanced Age >/= 65  Obesity, There is no height or weight on file to calculate BMI., BMI >/= 30 associated with increased stroke risk, recommend weight loss, diet and exercise as appropriate    Other Active Problems CKD3 Avoid contrast when possible Renally dose medications as appropriate  Hospital day # 1  Patient continues to have dysarthria and facial and right leg weakness.  Recommend aspirin and Plavix for 3 weeks followed by Plavix alone and aggressive risk factor modification.  Patient is participating in the sleep smart stroke prevention study.  She tested positive for sleep apnea on the overnight NOx 3 monitor last night.  She will undergo CPAP mask tolerability test tonight.  Continue ongoing therapies and transfer to inpatient rehab when bed available and after insurance approval in the next few days greater than 50% time during this 35-minute prolonged visit was spent on counseling and coordination of care about lacunar stroke and discussion about stroke prevention and treatment risk factor modification and answering questions.  Antony Contras, MD Medical Director [facility] Pager: [phone] 10/04/2021 1:52 PM   To contact Stroke Continuity provider, please refer to [email or web address] After hours, contact General Neurology

## 2021-10-04 NOTE — Progress Notes (Signed)
Pt BP is high (See flowsheet), Dr Royal Piedra notified, Pt asymptomatic. Will continue to monitor pt.

## 2021-10-04 NOTE — Progress Notes (Signed)
°  Transition of Care [facility]) Screening Note   Patient Details  Name: Donicia Emry Date of Birth: 1946/07/22   Transition of Care [facility]) CM/SW Contact:    Pollie Friar, RN Phone Number: 10/04/2021, 1:02 PM    Transition of Care Department [facility]) has reviewed patient. We will continue to monitor patient advancement through interdisciplinary progression rounds. If new patient transition needs arise, please place a TOC consult.

## 2021-10-04 NOTE — Progress Notes (Addendum)
Family Medicine Teaching Service Daily Progress Note Intern Pager: [phone]  Patient name: Tamara Herring Medical record number: [id number] Date of birth: [birth year] Age: 76 y.o. Gender: female  Primary Care Provider: [name], FNP Consultants: Neuro Code Status: DNR  Pt Overview and Major Events to Date:  1/2: Admitted  Assessment and Plan: Tamara Herring is a 76 y.o. female presenting with bilateral upper and lower extremity weakness and difficulty with speech. PMH is significant for HTN, HLD, T2DM, GERD, CKD3b, and CAD w/ MI.   CVA Vas carotid ultrasound noted stenosis of right left carotids consistent with 1-39%. Echo unremarkable with LVEF 55-60%.  Patient did not receive Plavix or aspirin since admission. -Neurology following, appreciate recs -PT/OT/SLP following, appreciate assistance -Continue Plavix 75 mg x 21 days with aspirin 81 mg daily, followed by aspirin alone  HTN BP 180s-190s/80s-90s.  Home medications include lisinopril 40 mg daily, and metoprolol 25 mg daily according to 6 month dispense history per pharmacy. -restart lisinopril 40mg  daily -consider transition to ARB tomorrow  HLD   CAD w/ hx MI LDL 97, goal <70. Neuro advised atorvastatin 40mg  daily.  This was not received on day of initiation. -continue atorvastatin 40mg  daily.  T2DM Required 6u SSI in the last 24 hours. CBG range 103-174, likely impacted by poor ability to eat currently.  Home insulin medication is NovoLog 70/30 mix 30 units twice daily.  Given patient's current glucose levels and poor intake due to dysarthria, I do not advise returning to home dosing at this time.  Consider initiation of SGLT2 given history of MI.  However patient does have notable kidney disease and currently watching creatinine with GFR 31. -Continue CBG monitoring every 4 hours -Sensitive SSI  Anemia Per chart review, patient has history of anemia believed to be iron deficiency attempting to be treated chronically  taking ferrous sulfate 325 mg daily.  No evidence of work-up via colonoscopy.  Consider outpatient colonoscopy for further anemia work-up.  FEN/GI: Dysphagia 2 PPx: Lovenox Dispo:CIR pending clinical improvement . Barriers include neurology recs.   Subjective:  Patient believes that her speech is improving although unsure about her weakness. She is amenable to rehab unit.   Objective: Temp:  [98.1 F (36.7 C)-99.1 F (37.3 C)] 98.3 F (36.8 C) (01/04 0355) Pulse Rate:  [72-90] 86 (01/04 0355) Resp:  [15-25] 19 (01/04 0355) BP: (151-203)/(68-111) 185/95 (01/04 0355) SpO2:  [94 %-100 %] 100 % (01/04 0355) Physical Exam: General: awake and alert, NAD Cardiovascular: RRR, no murmurs auscultated Respiratory: CTA B, no increased work of breathing Neuro: 3/5 R U/LE strength, 4/5 L U/LE strength, right sided facial droop, improved dysarthria  Laboratory: Recent Labs  Lab 10/02/21 1151 10/02/21 1159  WBC 7.6  --   HGB 11.8* 12.9  HCT 36.8 38.0  PLT 251  --    Recent Labs  Lab 10/02/21 1151 10/02/21 1159 10/03/21 1700  NA 135 138 136  K 4.0 4.0 3.9  CL 103 105 103  CO2 21*  --  21*  BUN 16 17 21   CREATININE 1.56* 1.50* 1.68*  CALCIUM 8.8*  --  8.9  PROT 7.6  --   --   BILITOT 0.8  --   --   ALKPHOS 77  --   --   ALT 15  --   --   AST 20  --   --   GLUCOSE 137* 132* 144*    CBG (last 3)  Recent Labs    10/03/21 1945 10/03/21 2342 10/04/21  0348  GLUCAP 166* 103* 147*     Imaging/Diagnostic Tests: DG Swallowing Func-Speech Pathology  Result Date: 10/03/2021 Table formatting from the original result was not included. Objective Swallowing Evaluation: Type of Study: MBS-Modified Barium Swallow Study  Patient Details Name: Tamara Herring MRN: [id number] Date of Birth: 02/01/1946 Today's Date: 10/03/2021 Time: SLP Start Time (ACUTE ONLY): 1322 -SLP Stop Time (ACUTE ONLY): 1343 SLP Time Calculation (min) (ACUTE ONLY): 21 min Past Medical History: Past Medical History:  Diagnosis Date  Diabetes mellitus without complication (HCC)   Hypertension  Past Surgical History: No past surgical history on file. HPI: Pt is a 76 yo female presenting with R sided weakness difficulty with speech. MRI positive for acute infarct involving the L lentiform nucleus and corona radiata. PMH includes:  HTN, HLD, T2DM, GERD, CKD3b  Subjective: pt is concerned about speech changes  Recommendations for follow up therapy are one component of a multi-disciplinary discharge planning process, led by the attending physician.  Recommendations may be updated based on patient status, additional functional criteria and insurance authorization. Assessment / Plan / Recommendation Clinical Impressions 10/03/2021 Clinical Impression Pt has an oropharyngeal dysphagia with oral weakness and reduced coordination. She has reduced lingual manipulation contributing to slow posterior transit and lingual residue. Weak labial seal allows all liquids to escape from the R side of her oral cavity. Reduced coordination of bolus transit results in inconsistent timing for swallow trigger with thin and nectar thick liquids. She has reduced epiglottic inversion that is in part related to suspected cervical osteophyte (C3-C4, MD not present to confirm), and when parts of the bolus spills beyond the valleculae before the swallow, it is able to enter the airway. Aspiration is trace but silent. Attempted to use strategies, including chin tuck, to increase containment above the valleculae, but she has trouble implementing strategies in a consistent manner. She has slower transit with honey thick liquids and solids, but this gives her more time to coordinate her swallowing. Given limitations with mastication as well, will start with Dys 2 (finely chopped) diet and honey thick liquids. SLP Visit Diagnosis Dysphagia, oropharyngeal phase (R13.12) Attention and concentration deficit following -- Frontal lobe and executive function deficit  following -- Impact on safety and function Moderate aspiration risk   Treatment Recommendations 10/03/2021 Treatment Recommendations Therapy as outlined in treatment plan below   Prognosis 10/03/2021 Prognosis for Safe Diet Advancement Good Barriers to Reach Goals Cognitive deficits Barriers/Prognosis Comment -- Diet Recommendations 10/03/2021 SLP Diet Recommendations Dysphagia 2 (Fine chop) solids;Honey thick liquids Liquid Administration via Cup;Straw Medication Administration Crushed with puree Compensations Slow rate;Small sips/bites;Monitor for anterior loss;Lingual sweep for clearance of pocketing Postural Changes Seated upright at 90 degrees   Other Recommendations 10/03/2021 Recommended Consults -- Oral Care Recommendations Oral care BID Other Recommendations -- Follow Up Recommendations Acute inpatient rehab (3hours/day) Assistance recommended at discharge Intermittent Supervision/Assistance Functional Status Assessment Patient has had a recent decline in their functional status and demonstrates the ability to make significant improvements in function in a reasonable and predictable amount of time. Frequency and Duration  10/03/2021 Speech Therapy Frequency (ACUTE ONLY) min 2x/week Treatment Duration 2 weeks   Oral Phase 10/03/2021 Oral Phase Impaired Oral - Pudding Teaspoon -- Oral - Pudding Cup -- Oral - Honey Teaspoon -- Oral - Honey Cup Delayed oral transit;Weak lingual manipulation;Lingual/palatal residue Oral - Nectar Teaspoon -- Oral - Nectar Cup Delayed oral transit;Premature spillage;Weak lingual manipulation;Lingual/palatal residue Oral - Nectar Straw Delayed oral transit;Premature spillage;Weak lingual manipulation;Lingual/palatal residue Oral - Thin  Teaspoon -- Oral - Thin Cup Delayed oral transit;Premature spillage;Weak lingual manipulation;Lingual/palatal residue Oral - Thin Straw Delayed oral transit;Premature spillage;Weak lingual manipulation;Lingual/palatal residue Oral - Puree Delayed oral  transit;Weak lingual manipulation;Lingual/palatal residue Oral - Mech Soft -- Oral - Regular Delayed oral transit;Weak lingual manipulation;Lingual/palatal residue;Impaired mastication Oral - Multi-Consistency -- Oral - Pill -- Oral Phase - Comment --  Pharyngeal Phase 10/03/2021 Pharyngeal Phase Impaired Pharyngeal- Pudding Teaspoon -- Pharyngeal -- Pharyngeal- Pudding Cup -- Pharyngeal -- Pharyngeal- Honey Teaspoon -- Pharyngeal -- Pharyngeal- Honey Cup Reduced epiglottic inversion Pharyngeal -- Pharyngeal- Nectar Teaspoon -- Pharyngeal -- Pharyngeal- Nectar Cup Reduced epiglottic inversion;Penetration/Aspiration before swallow Pharyngeal Material enters airway, passes BELOW cords without attempt by patient to eject out (silent aspiration) Pharyngeal- Nectar Straw Reduced epiglottic inversion;Penetration/Aspiration before swallow Pharyngeal Material enters airway, passes BELOW cords without attempt by patient to eject out (silent aspiration) Pharyngeal- Thin Teaspoon -- Pharyngeal -- Pharyngeal- Thin Cup Reduced epiglottic inversion;Penetration/Aspiration before swallow Pharyngeal Material enters airway, passes BELOW cords without attempt by patient to eject out (silent aspiration) Pharyngeal- Thin Straw Reduced epiglottic inversion;Penetration/Aspiration before swallow Pharyngeal Material enters airway, passes BELOW cords without attempt by patient to eject out (silent aspiration) Pharyngeal- Puree Reduced epiglottic inversion Pharyngeal -- Pharyngeal- Mechanical Soft -- Pharyngeal -- Pharyngeal- Regular Reduced epiglottic inversion Pharyngeal -- Pharyngeal- Multi-consistency -- Pharyngeal -- Pharyngeal- Pill -- Pharyngeal -- Pharyngeal Comment --  Cervical Esophageal Phase  10/03/2021 Cervical Esophageal Phase WFL Pudding Teaspoon -- Pudding Cup -- Honey Teaspoon -- Honey Cup -- Nectar Teaspoon -- Nectar Cup -- Nectar Straw -- Thin Teaspoon -- Thin Cup -- Thin Straw -- Puree -- Mechanical Soft -- Regular --  Multi-consistency -- Pill -- Cervical Esophageal Comment -- Mahala MenghiniLaura N., M.A. CCC-SLP Acute Rehabilitation Services Pager [phone] Office [phone] 10/03/2021, 4:03 PM                     ECHOCARDIOGRAM COMPLETE  Result Date: 10/03/2021    ECHOCARDIOGRAM REPORT   [facility] Date of Exam: 10/03/2021 Medical Rec #:  [id number]      Height:       66.0 in Accession #:    [id number]     Weight:       216.0 lb Date of Birth:  02/13/1946       BSA:          2.066 m Patient Age:    75 years       BP:           177/76 mmHg Patient Gender: F              HR:           82 bpm. Exam Location:  Inpatient Procedure: 2D Echo, Cardiac Doppler, Color Doppler and Strain Analysis Indications:   Stroke  History:       Patient has no prior history of Echocardiogram examinations.                Stroke.  Sonographer:   Vanetta ShawlBrittney Adkins Referring      [id number] HawthorneKEHINDE T ENIOLA Phys: IMPRESSIONS  1. Left ventricular ejection fraction, by estimation, is 55 to 60%. The left ventricle has normal function. The left ventricle has no regional wall motion abnormalities. There is severe concentric left ventricular hypertrophy. Left ventricular diastolic  parameters are indeterminate.  2. Right ventricular systolic function is normal. The right ventricular size is normal.  3. The mitral valve is normal in structure. No evidence of  mitral valve regurgitation. No evidence of mitral stenosis.  4. The aortic valve is tricuspid. Aortic valve regurgitation is not visualized. No aortic stenosis is present.  5. The inferior vena cava is normal in size with greater than 50% respiratory variability, suggesting right atrial pressure of 3 mmHg. Comparison(s): No prior Echocardiogram. FINDINGS  Left Ventricle: Left ventricular ejection fraction, by estimation, is 55 to 60%. The left ventricle has normal function. The left ventricle has no regional wall motion abnormalities. Global longitudinal strain performed but not reported based on  interpreter judgement due to suboptimal tracking. The left ventricular internal cavity size was normal in size. There is severe concentric left ventricular hypertrophy. Left ventricular diastolic parameters are indeterminate. Right Ventricle: The right ventricular size is normal. No increase in right ventricular wall thickness. Right ventricular systolic function is normal. Left Atrium: Left atrial size was normal in size. Right Atrium: Right atrial size was normal in size. Pericardium: There is no evidence of pericardial effusion. Presence of epicardial fat layer. Mitral Valve: The mitral valve is normal in structure. Mild mitral annular calcification. No evidence of mitral valve regurgitation. No evidence of mitral valve stenosis. Tricuspid Valve: The tricuspid valve is normal in structure. Tricuspid valve regurgitation is mild . No evidence of tricuspid stenosis. Aortic Valve: The aortic valve is tricuspid. Aortic valve regurgitation is not visualized. No aortic stenosis is present. Pulmonic Valve: The pulmonic valve was not well visualized. Pulmonic valve regurgitation is not visualized. No evidence of pulmonic stenosis. Aorta: The aortic root is normal in size and structure. Venous: The inferior vena cava is normal in size with greater than 50% respiratory variability, suggesting right atrial pressure of 3 mmHg. IAS/Shunts: No atrial level shunt detected by color flow Doppler.  LEFT VENTRICLE PLAX 2D LVIDd:         4.30 cm   Diastology LVIDs:         3.40 cm   LV e' medial:   3.59 cm/s LV PW:         1.90 cm   LV E/e' medial: 17.1 LV IVS:        1.80 cm LVOT diam:     2.20 cm LVOT Area:     3.80 cm  RIGHT VENTRICLE RV Basal diam:  3.50 cm RV S prime:     9.57 cm/s LEFT ATRIUM           Index        RIGHT ATRIUM           Index LA diam:      3.80 cm 1.84 cm/m   RA Area:     18.40 cm LA Vol (A4C): 63.1 ml 30.54 ml/m  RA Volume:   44.90 ml  21.73 ml/m                        PULMONIC VALVE AORTA                  PV Vmax:       0.97 m/s Ao Root diam: 3.30 cm PV Peak grad:  3.7 mmHg Ao Asc diam:  3.30 cm  MITRAL VALVE MV Area (PHT): 6.54 cm     SHUNTS MV Decel Time: 116 msec     Systemic Diam: 2.20 cm MV E velocity: 61.30 cm/s MV A velocity: 115.00 cm/s MV E/A ratio:  0.53 Kardie Tobb DO Electronically signed by Thomasene Ripple DO Signature Date/Time: 10/03/2021/12:52:45 PM    Final  VAS US CAROTID  Result Date: 10/03/2021 Carotid Arterial Duplex Study Patient Name:  Tamara Herring  Date of Exam:   10/03/2021 Medical Rec #: [id number]       Accession #:    [id number] Date of Birth: 08-Aug-1946        Patient Gender: F Patient Age:   14 years Exam [facility]:  [facility] Procedure:      VAS US CAROTID Referring Phys: Terrilee Files [facility] --------------------------------------------------------------------------------  Indications:       CVA. Risk Factors:      Hypertension, Diabetes. Comparison Study:  no prior Performing Technologist: [place] Ponder RVS  Examination Guidelines: A complete evaluation includes B-mode imaging, spectral Doppler, color Doppler, and power Doppler as needed of all accessible portions of each vessel. Bilateral testing is considered an integral part of a complete examination. Limited examinations for reoccurring indications may be performed as noted.  Right Carotid Findings: +----------+--------+--------+--------+------------------+--------+             PSV cm/s EDV cm/s Stenosis Plaque Description Comments  +----------+--------+--------+--------+------------------+--------+  CCA Prox   111      15                heterogenous                 +----------+--------+--------+--------+------------------+--------+  CCA Distal 43       8                 heterogenous                 +----------+--------+--------+--------+------------------+--------+  ICA Prox   53       15       1-39%    heterogenous                 +----------+--------+--------+--------+------------------+--------+  ICA Distal 94       27                                              +----------+--------+--------+--------+------------------+--------+  ECA        60                                                      +----------+--------+--------+--------+------------------+--------+ +----------+--------+-------+--------+-------------------+             PSV cm/s EDV cms Describe Arm Pressure (mmHG)  +----------+--------+-------+--------+-------------------+  Subclavian 72                                             +----------+--------+-------+--------+-------------------+ +---------+--------+--+--------+--+---------+  Vertebral PSV cm/s 48 EDV cm/s 13 Antegrade  +---------+--------+--+--------+--+---------+  Left Carotid Findings: +----------+--------+--------+--------+------------------+--------+             PSV cm/s EDV cm/s Stenosis Plaque Description Comments  +----------+--------+--------+--------+------------------+--------+  CCA Prox   84       9                 heterogenous                 +----------+--------+--------+--------+------------------+--------+  CCA Distal 55       8  heterogenous                 +----------+--------+--------+--------+------------------+--------+  ICA Prox   53       16       1-39%    heterogenous                 +----------+--------+--------+--------+------------------+--------+  ICA Distal 54       14                                             +----------+--------+--------+--------+------------------+--------+  ECA        71                                                      +----------+--------+--------+--------+------------------+--------+ +----------+--------+--------+--------+-------------------+             PSV cm/s EDV cm/s Describe Arm Pressure (mmHG)  +----------+--------+--------+--------+-------------------+  Subclavian 70                                              +----------+--------+--------+--------+-------------------+ +---------+--------+--+--------+--+---------+  Vertebral PSV  cm/s 48 EDV cm/s 13 Antegrade  +---------+--------+--+--------+--+---------+   Summary: Right Carotid: Velocities in the right ICA are consistent with a 1-39% stenosis. Left Carotid: Velocities in the left ICA are consistent with a 1-39% stenosis. Vertebrals: Bilateral vertebral arteries demonstrate antegrade flow. *See table(s) above for measurements and observations.  Electronically signed by Delia HeadyPramod Sethi MD on 10/03/2021 at 2:20:06 PM.    Final     Shelby Mattocksahbura, Jeanny Rymer, DO 10/04/2021, 7:22 AM PGY-1, [facility] FPTS Intern pager: [phone], text pages welcome]

## 2021-10-05 LAB — BASIC METABOLIC PANEL
Anion gap: 9 (ref 5–15)
BUN: 22 mg/dL (ref 8–23)
CO2: 21 mmol/L — ABNORMAL LOW (ref 22–32)
Calcium: 9.1 mg/dL (ref 8.9–10.3)
Chloride: 104 mmol/L (ref 98–111)
Creatinine, Ser: 1.73 mg/dL — ABNORMAL HIGH (ref 0.44–1.00)
GFR, Estimated: 30 mL/min — ABNORMAL LOW (ref >60)
Glucose, Bld: 134 mg/dL — ABNORMAL HIGH (ref 70–99)
Potassium: 3.5 mmol/L (ref 3.5–5.1)
Sodium: 134 mmol/L — ABNORMAL LOW (ref 135–145)

## 2021-10-05 LAB — GLUCOSE, CAPILLARY
Glucose-Capillary: 140 mg/dL — ABNORMAL HIGH (ref 70–99)
Glucose-Capillary: 152 mg/dL — ABNORMAL HIGH (ref 70–99)
Glucose-Capillary: 171 mg/dL — ABNORMAL HIGH (ref 70–99)
Glucose-Capillary: 159 mg/dL — ABNORMAL HIGH (ref 70–99)

## 2021-10-05 LAB — CBC
HCT: 37.1 % (ref 36.0–46.0)
Hemoglobin: 12.1 g/dL (ref 12.0–15.0)
MCH: 26.4 pg (ref 26.0–34.0)
MCHC: 32.6 g/dL (ref 30.0–36.0)
MCV: 80.8 fL (ref 80.0–100.0)
Platelets: 238 10*3/uL (ref 150–400)
RBC: 4.59 MIL/uL (ref 3.87–5.11)
RDW: 15.9 % — ABNORMAL HIGH (ref 11.5–15.5)
WBC: 8.2 10*3/uL (ref 4.0–10.5)
nRBC: 0 % (ref 0.0–0.2)

## 2021-10-05 MED ORDER — CLOPIDOGREL BISULFATE 75 MG PO TABS
75.0000 mg | ORAL_TABLET | Freq: Every day | ORAL | Status: DC
  Administered 2021-10-06: 75 mg via ORAL
  Filled 2021-10-05: qty 1

## 2021-10-05 MED ORDER — AMLODIPINE BESYLATE 5 MG PO TABS
5.0000 mg | ORAL_TABLET | Freq: Every day | ORAL | Status: DC
  Administered 2021-10-05 – 2021-10-06 (×2): 5 mg via ORAL
  Filled 2021-10-05 (×2): qty 1

## 2021-10-05 NOTE — Progress Notes (Signed)
Pt. Is currently not receiving any IV medications. Best practice states to insert PIVs when needed. Notified RN that we are available 24 hrs if patient condition changes or IV meds are ordered and a PIV becomes a necessity.

## 2021-10-05 NOTE — Progress Notes (Signed)
Went to see patient started shift.  Patient had no concerns and was in no distress.  BP (!) 180/88 (BP Location: Left Arm)    Pulse 100    Temp 98.5 F (36.9 C) (Oral)    Resp 18    Ht 5\' 4"  (1.626 m)    Wt 91.8 kg    SpO2 99%    BMI 34.74 kg/m   General: Alert, no apparent distress Lungs: No respiratory stress on room air Skin: Warm and dry  A/P 76 year old female being worked up for acute CVA.  Notable events today include stroke team is signed off.  It was determined she was not a candidate for CIR and so we are pursuing a likely discharge with home health.  We also increased her amlodipine today for hypertension.  Remainder per daytime progress note.

## 2021-10-05 NOTE — Progress Notes (Signed)
Occupational Therapy Treatment Patient Details Name: Tamara Herring MRN: [id number] DOB: 1945-10-14 Today's Date: 10/05/2021   History of present illness 76 y.o. female who presents with dysarthric speech. R facial droop and mild right upper & lower extremity weakness. 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  PMH significant for DM2, HTN, HLD, CKD3b, GERD.   OT comments  Patient received in bed and agreeable to OT treatment. Patient wanted to address dressing due to expected to discharge home today. Patient was able to get to EOB with mod assist and was mod assist to [employer] and stood with mod assist to allow gown to come down.  Patient performed transfer into chair with RW and mod assist and performed grooming seated in chair.  AAROM performed to RUE with education to patient and son on how to perform at home.  Patient is expected to discharge home today.    Recommendations for follow up therapy are one component of a multi-disciplinary discharge planning process, led by the attending physician.  Recommendations may be updated based on patient status, additional functional criteria and insurance authorization.    Follow Up Recommendations  Home health OT    Assistance Recommended at Discharge Frequent or constant Supervision/Assistance  Patient can return home with the following  A lot of help with walking and/or transfers;A lot of help with bathing/dressing/bathroom;Assistance with cooking/housework;Direct supervision/assist for medications management;Direct supervision/assist for financial management;Assist for transportation;Help with stairs or ramp for entrance   Equipment Recommendations  BSC/3in1    Recommendations for Other Services      Precautions / Restrictions Precautions Precautions: Fall Precaution Comments: permissive HTN <220 Restrictions Weight Bearing Restrictions: No       Mobility Bed Mobility Overal bed mobility: Needs Assistance Bed  Mobility: Supine to Sit     Supine to sit: Mod assist     General bed mobility comments: mod assist of one with assistance for trunk and verbal cues for rail use    Transfers Overall transfer level: Needs assistance Equipment used: Rolling walker (2 wheels) Transfers: Sit to/from Stand;Bed to chair/wheelchair/BSC Sit to Stand: Mod assist   Step pivot transfers: Mod assist       General transfer comment: difficulty keeping grasp with right hand on RW     Balance Overall balance assessment: Needs assistance Sitting-balance support: No upper extremity supported;Feet supported Sitting balance-Leahy Scale: Fair     Standing balance support: Bilateral upper extremity supported Standing balance-Leahy Scale: Poor Standing balance comment: Reliant on BUE and external support                           ADL either performed or assessed with clinical judgement   ADL Overall ADL's : Needs assistance/impaired     Grooming: Brushing hair;Minimal assistance Grooming Details (indicate cue type and reason): min assist to complete         Upper Body Dressing : Moderate assistance;Sitting Upper Body Dressing Details (indicate cue type and reason): performed donning overhead gown while sitting on EOB Lower Body Dressing: Moderate assistance;Sit to/from stand Lower Body Dressing Details (indicate cue type and reason): donned slip on "crocs" with mod assist, recommended not wearing due to ill fitting               General ADL Comments: performed dressing to prepare for possible dischare home    Extremity/Trunk Assessment Upper Extremity Assessment RUE Deficits / Details: isolated movement out of synergy pattern; able to  touch hand to mouth with min A; gross grasp/release; beginning to demonstrate isolated finger movement; @ 30 degrees active shoulder flexion; hikes shoulder; difficulty using functionally at this time RUE Sensation: decreased light touch RUE  Coordination: decreased fine motor;decreased gross motor            Vision       Perception     Praxis      Cognition Arousal/Alertness: Awake/alert Behavior During Therapy: Flat affect Overall Cognitive Status: Impaired/Different from baseline Area of Impairment: Attention;Safety/judgement;Awareness;Problem solving                   Current Attention Level: Sustained     Safety/Judgement: Decreased awareness of safety;Decreased awareness of deficits Awareness: Emergent Problem Solving: Slow processing General Comments: frequent cues for safety          Exercises Exercises: General Upper Extremity General Exercises - Upper Extremity Shoulder Flexion: AAROM;Right;10 reps Shoulder Extension: AAROM;Right;10 reps Shoulder ABduction: AAROM;Right;10 reps Shoulder ADduction: AAROM;Right;10 reps Elbow Flexion: AAROM;Right;10 reps Elbow Extension: AAROM;Right;10 reps   Shoulder Instructions       General Comments      Pertinent Vitals/ Pain       Pain Assessment: No/denies pain  Home Living                                          Prior Functioning/Environment              Frequency  Min 2X/week        Progress Toward Goals  OT Goals(current goals can now be found in the care plan section)  Progress towards OT goals: Progressing toward goals  Acute Rehab OT Goals Patient Stated Goal: go home OT Goal Formulation: With patient/family Time For Goal Achievement: 10/17/21 Potential to Achieve Goals: Good ADL Goals Pt Will Perform Grooming: with set-up;sitting Pt Will Perform Upper Body Bathing: with set-up;sitting Pt Will Perform Lower Body Bathing: with min guard assist;sit to/from stand Pt Will Transfer to Toilet: stand pivot transfer;bedside commode;with min assist  Plan Discharge plan remains appropriate    Co-evaluation                 AM-PAC OT "6 Clicks" Daily Activity     Outcome Measure   Help from  another person eating meals?: A Little Help from another person taking care of personal grooming?: A Lot Help from another person toileting, which includes using toliet, bedpan, or urinal?: A Lot Help from another person bathing (including washing, rinsing, drying)?: A Lot Help from another person to put on and taking off regular upper body clothing?: A Lot Help from another person to put on and taking off regular lower body clothing?: A Lot 6 Click Score: 13    End of Session Equipment Utilized During Treatment: Gait belt;Rolling walker (2 wheels)  OT Visit Diagnosis: Unsteadiness on feet (R26.81);Other abnormalities of gait and mobility (R26.89);Muscle weakness (generalized) (M62.81);Low vision, both eyes (H54.2);Other symptoms and signs involving cognitive function;Other symptoms and signs involving the nervous system (R29.898);Hemiplegia and hemiparesis Hemiplegia - Right/Left: Right Hemiplegia - dominant/non-dominant: Dominant Hemiplegia - caused by: Cerebral infarction   Activity Tolerance Patient tolerated treatment well   Patient Left in chair;with call bell/phone within reach;with chair alarm set;with family/visitor present   Nurse Communication Mobility status        Time: [id number] OT Time Calculation (min): 29 min  Charges:  OT General Charges $OT Visit: 1 Visit OT Treatments $Self Care/Home Management : 8-22 mins $Neuromuscular Re-education: 8-22 mins  Lodema Hong, OTA Acute Rehabilitation Services  Pager [phone] Office [phone]   Trixie Dredge 10/05/2021, 1:35 PM

## 2021-10-05 NOTE — Progress Notes (Signed)
Speech Language Pathology Treatment: Dysphagia  Patient Details Name: Tamara Herring MRN: [id number] DOB: 07-Apr-1946 Today's Date: 10/05/2021 Time: 6226-3335 SLP Time Calculation (min) (ACUTE ONLY): 11 min  Assessment / Plan / Recommendation Clinical Impression  Pt was hesitant to do therapy this afternoon, stating that she was expecting to go home today, but was agreeable to education at least in regards to dysphagia recommendations. Pt is currently on Dys 2 diet and honey thick liquids. Trials were administered with evidence of ongoing oral dysphagia noted, including anterior loss and lingual residue, for which SLP provided Min cues. Pt did not have overt s/s of aspiration, but on MBS, aspiration was silent (and was observed with thinner consistencies). Education was provided to pt and granddaughter about consistencies and ways in which to prepare them at home. A box of sample thickener packets was also provided. Will continue to follow as she remains inpatient, but pt will benefit from [facility] SLP and 24/7 supervision if returning home.    HPI HPI: Pt is a 76 yo female presenting with R sided weakness difficulty with speech. MRI positive for acute infarct involving the L lentiform nucleus and corona radiata. PMH includes:  HTN, HLD, T2DM, GERD, CKD3b      SLP Plan  Continue with current plan of care      Recommendations for follow up therapy are one component of a multi-disciplinary discharge planning process, led by the attending physician.  Recommendations may be updated based on patient status, additional functional criteria and insurance authorization.    Recommendations  Diet recommendations: Dysphagia 2 (fine chop);Honey-thick liquid Liquids provided via: Cup;Straw Medication Administration: Crushed with puree Supervision: Patient able to self feed;Full supervision/cueing for compensatory strategies Compensations: Slow rate;Small sips/bites;Monitor for anterior loss;Lingual sweep for  clearance of pocketing Postural Changes and/or Swallow Maneuvers: Seated upright 90 degrees                Oral Care Recommendations: Oral care QID;Oral care BID Follow Up Recommendations: Home health SLP (pt and family decline CIR) Assistance recommended at discharge: Frequent or constant Supervision/Assistance SLP Visit Diagnosis: Dysphagia, oropharyngeal phase (R13.12) Plan: Continue with current plan of care           Mahala Menghini., M.A. CCC-SLP Acute Rehabilitation Services Pager [phone] Office [phone]  10/05/2021, 1:51 PM

## 2021-10-05 NOTE — Progress Notes (Signed)
STROKE TEAM PROGRESS NOTE   INTERVAL HISTORY Patient is seen in her room with her son at the bedside.  Neurological exam is unchanged.  Vital signs are stable.  Patient did not undergo CPAP mask tolerability trial last night as she refused.  She states she wants to go home today and she is also refusing inpatient rehab.    Vitals:   10/05/21 0100 10/05/21 0353 10/05/21 0809 10/05/21 1157  BP:  (!) 185/89 (!) 191/91 (!) 175/89  Pulse:  83 88 92  Resp:  18 18 19   Temp:  97.8 F (36.6 C) 98.4 F (36.9 C) 98.2 F (36.8 C)  TempSrc:  Oral Oral Oral  SpO2:  99% 97% 97%  Weight: 91.8 kg     Height: 5\' 4"  (1.626 m)      CBC:  Recent Labs  Lab 10/02/21 1151 10/02/21 1159 10/05/21 0228  WBC 7.6  --  8.2  NEUTROABS 4.3  --   --   HGB 11.8* 12.9 12.1  HCT 36.8 38.0 37.1  MCV 82.7  --  80.8  PLT 251  --  238   Basic Metabolic Panel:  Recent Labs  Lab 10/03/21 1700 10/05/21 0228  NA 136 134*  K 3.9 3.5  CL 103 104  CO2 21* 21*  GLUCOSE 144* 134*  BUN 21 22  CREATININE 1.68* 1.73*  CALCIUM 8.9 9.1   Lipid Panel:  Recent Labs  Lab 10/02/21 1701  CHOL 168  TRIG 89  HDL 53  CHOLHDL 3.2  VLDL 18  LDLCALC 97   HgbA1c:  Recent Labs  Lab 10/02/21 1701  HGBA1C 7.2*   Urine Drug Screen: No results for input(s): LABOPIA, COCAINSCRNUR, LABBENZ, AMPHETMU, THCU, LABBARB in the last 168 hours.  Alcohol Level No results for input(s): ETH in the last 168 hours.  IMAGING past 24 hours No results found.  PHYSICAL EXAM General:  Alert, obese elderly African-American lady female in no acute distress   NEURO:  Mental Status: AA&Ox3  Speech/Language: speech is with mild dysarthria  Cranial Nerves:  II: PERRL. Visual fields full.   III, IV, VI: EOMI. Eyelids elevate symmetrically.  V: Sensation is intact to light touch and symmetrical to face.  VII: Moderate right facial droop present VIII: hearing intact to voice. IX, X: Phonation is normal.  XII: right sided tongue  deviation Motor: 5/5 strength to LUE and LLE, 4/5 to RUE, 4/5 to RLE with slight drift.  Mild right grip weakness and diminished fine in the movements on the right and orbits left over right upper extremity. Sensation- Intact to light touch bilaterally. Extinction absent to light touch to DSS.   Coordination:Right leg drift Gait- deferred     ASSESSMENT/PLAN [name] is a 76 y.o. female with history of DM2, HTN, HLD, CKD3 and GERD presenting with dysarthric speech and right sided facial droop.   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  TNK was not given and thrombectomy was not performed as patient presented outside the window.  She reports that her blood pressure is not well controlled at home.   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 Small vessel disease.  MRI  Acute infarct of left lentiform nucleus and corona radiata, progressive atrophy and white matter disease MRA  No LVO, intracranial atherosclerosis without significant stenosis Carotid Doppler  1-39% stenosis of left and right carotids 2D Echo EF 55-60%, no atrial level shunt LDL 97 HgbA1c 7.2 VTE prophylaxis - lovenox    Diet   DIET DYS 2 Room service appropriate? Yes; Fluid consistency: Honey Thick   aspirin 81 mg daily prior to admission, now on aspirin 81 mg daily and clopidogrel 75 mg daily. For 3 weeks, then Plavix indefinitely Therapy recommendations:  CIR  Disposition: Home Hypertension Home meds:  lisinopril 40 mg daily Stable Permissive hypertension (OK if < 220/120) but gradually normalize in 5-7 days Long-term BP goal normotensive  Hyperlipidemia Home meds:  none LDL 97, goal < 70 Add atorvastatin 40 mg daily  Continue statin at discharge  Diabetes type II Uncontrolled Home meds:  insulin 70/30, 30 units BID HgbA1c 7.2, goal <  7.0 CBGs Recent Labs    10/04/21 2204 10/05/21 0600 10/05/21 1204  GLUCAP 155* 140* 152*    SSI  Other Stroke Risk Factors Advanced Age >/= 49  Obesity, Body mass index is 34.74 kg/m., BMI >/= 30 associated with increased stroke risk, recommend weight loss, diet and exercise as appropriate    Other Active Problems CKD3 Avoid contrast when possible Renally dose medications as appropriate  Hospital day # 2  Patient continues to have dysarthria and facial and right leg weakness.  Recommend aspirin and Plavix for 3 weeks followed by Plavix alone and aggressive risk factor modification.  Patient was participating in the sleep smart stroke prevention study.  But is now a screen failure as she refused to try the CPAP mask trial.  Patient is being discharged home upon her request.  Follow-up as an outpatient stroke clinic in 2 months.  Stroke team will sign off.  Kindly call for questions.   Delia Heady, MD Medical Director [facility] Pager: [phone] 10/05/2021 1:18 PM   To contact Stroke Continuity provider, please refer to [email or web address]. After hours, contact General Neurology

## 2021-10-05 NOTE — Progress Notes (Signed)
Inpatient Rehabilitation Admissions Coordinator   I received call from her daughter, Jamesetta So. She states patient and family are requesting discharge directly home with Home health and family to provide 24/7 care. Jamesetta So is a CNA and states she would like to discuss Home health set up and DME that they will need. I have alerted acute team and TOC of daughter's request. We will not pursue Cir admit at this time.  Ottie Glazier, RN, MSN Rehab Admissions Coordinator [phone] 10/05/2021 10:26 AM

## 2021-10-05 NOTE — Progress Notes (Signed)
Family Medicine Teaching Service Daily Progress Note Intern Pager: [phone]  Patient name: Tamara Herring Medical record number: [id number] Date of birth: 11-Jan-1946 Age: 76 y.o. Gender: female  Primary Care Provider: [name], FNP Consultants: Neuro Code Status: DNR  Pt Overview and Major Events to Date:  1/2: Admitted  Assessment and Plan:  Tamara Herring is a 76 y.o. female presenting with bilateral upper and lower extremity weakness and difficulty with speech. PMH is significant for HTN, HLD, T2DM, GERD, CKD3b, and CAD w/ MI.    CVA PT Recommending CIR, insurranc auth began. Patient and family wanting to go home with home health.  -Neurology following, appreciate recs -PT/OT/SLP, appreciate recs -Continue Plavix 75 mg x 21 days with ASA 81 mg daily (Day 2/21), then start Plavix alone -Continuous cardiac monitoring & continuous pulse ox -CPAP for OSA, patient part of sleep smart stroke prevention study  HTN BP range 170-190's systolic / 80-100 diastolic . Patient switched to Lorsartan for renal effects of lisinopril. Permissive htn, but will trend down BP over next 5-7 days -Losartan 100 mg daily -Start Amlodipine 5 mg -Continue to monitor  HLD   CAD w/ hx MI -Atorvastatin 40 mg daily  T2DM Patient on SSI with CBG range 120-160, mostly in 140's. She took 5 units of insulin over the last 24 hours. -Continue Sensitive SSI -CBG monitoring q4h  Anemia Thought to be IDA -Fe Sulfate 325 mg daily  CKD 3b Cr 1.73, baseline unknown, lowest has been 1.50. GFR 30 -Continue to monitor -Avoid nephrotoxic drugs   FEN/GI: Dysphagia 2 PPx: Lovenox Dispo: Home with Home Health  in 2-3 days. Barriers include .   Subjective:  Patient with no complaints of headache or pain. Had BM last night.   Objective: Temp:  [97.5 F (36.4 C)-98.8 F (37.1 C)] 97.8 F (36.6 C) (01/05 0353) Pulse Rate:  [76-89] 83 (01/05 0353) Resp:  [17-20] 18 (01/05 0353) BP: (170-198)/(77-101)  185/89 (01/05 0353) SpO2:  [97 %-99 %] 99 % (01/05 0353) Weight:  [91.8 kg] 91.8 kg (01/05 0100) Physical Exam: General: Polite, teary eyed, African American woman Cardiovascular: RRR, NRMG Respiratory: CTABL Abdomen: Soft, NTTP, Non-distended Extremities: Moving all extremities independently Neuro: Sensation intact globally. Motor strength 4/5 on RUE, 3/5 on RLE, 5/5 on Left upper and lower extremity. Right sided facial droop, dysarthria.   Laboratory: Recent Labs  Lab 10/02/21 1151 10/02/21 1159 10/05/21 0228  WBC 7.6  --  8.2  HGB 11.8* 12.9 12.1  HCT 36.8 38.0 37.1  PLT 251  --  238   Recent Labs  Lab 10/02/21 1151 10/02/21 1159 10/03/21 1700 10/05/21 0228  NA 135 138 136 134*  K 4.0 4.0 3.9 3.5  CL 103 105 103 104  CO2 21*  --  21* 21*  BUN 16 17 21 22   CREATININE 1.56* 1.50* 1.68* 1.73*  CALCIUM 8.8*  --  8.9 9.1  PROT 7.6  --   --   --   BILITOT 0.8  --   --   --   ALKPHOS 77  --   --   --   ALT 15  --   --   --   AST 20  --   --   --   GLUCOSE 137* 132* 144* 134*      Imaging/Diagnostic Tests:   , MD 10/05/2021, 6:32 AM PGY-1, [facility] Family Medicine FPTS Intern pager: [phone], text pages welcome

## 2021-10-05 NOTE — Progress Notes (Signed)
Physical Therapy Treatment Patient Details Name: Tamara Herring MRN: [id number] DOB: 1946-05-15 Today's Date: 10/05/2021   History of Present Illness 76 y.o. female who presents with dysarthric speech. R facial droop and mild right upper & lower extremity weakness. 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  PMH significant for DM2, HTN, HLD, CKD3b, GERD.    PT Comments    Patient progressing this session to hallway ambulation and family support present with plans for home with family and HHPT.  Patient and grandaughter report they already took home the walker and 3:1 that was delivered.  Attempted to educate family (grandaughter and family on the phone) in assist for ambulation and stairs.  Patient did fatigue needing seated rest.  Will follow up if not d/c today.    Recommendations for follow up therapy are one component of a multi-disciplinary discharge planning process, led by the attending physician.  Recommendations may be updated based on patient status, additional functional criteria and insurance authorization.  Follow Up Recommendations  Home health PT     Assistance Recommended at Discharge Frequent or constant Supervision/Assistance  Patient can return home with the following A lot of help with walking and/or transfers;Direct supervision/assist for medications management;A lot of help with bathing/dressing/bathroom;Assist for transportation;Help with stairs or ramp for entrance   Equipment Recommendations  Rolling walker (2 wheels);Wheelchair cushion (measurements PT);Wheelchair (measurements PT)    Recommendations for Other Services       Precautions / Restrictions Precautions Precautions: Fall     Mobility  Bed Mobility               General bed mobility comments: up in chair    Transfers Overall transfer level: Needs assistance Equipment used: Rolling walker (2 wheels) Transfers: Sit to/from Stand;Bed to chair/wheelchair/BSC Sit to Stand: Mod  assist     Step pivot transfers: Mod assist     General transfer comment: cues for safety up to walker assist for balance and R UE placement; squat pivot to [facility] with mod A, then she assisted herself back to recliner prior to PT close enough to assist, grandaughter in the room and pt educated to wait for assistance    Ambulation/Gait Ambulation/Gait assistance: Mod assist Gait Distance (Feet): 40 Feet (&12') Assistive device: Rolling walker (2 wheels) Gait Pattern/deviations: Step-to pattern;Decreased stride length;Shuffle;Decreased dorsiflexion - right;Wide base of support;Knee hyperextension - right       General Gait Details: assist at times for R UE on walker and for walker management. R knee hyperextension in stance without pain per pt report, fatigued in hallway so sitting to rest HR 113.  Then up to walk back to room until assisted to recliner behind her.   Stairs             Wheelchair Mobility    Modified Rankin (Stroke Patients Only) Modified Rankin (Stroke Patients Only) Pre-Morbid Rankin Score: No symptoms Modified Rankin: Moderately severe disability     Balance Overall balance assessment: Needs assistance Sitting-balance support: Feet supported Sitting balance-Leahy Scale: Fair     Standing balance support: Bilateral upper extremity supported Standing balance-Leahy Scale: Poor Standing balance comment: at least min A for balance                            Cognition Arousal/Alertness: Awake/alert Behavior During Therapy: Impulsive Overall Cognitive Status: Impaired/Different from baseline Area of Impairment: Attention;Safety/judgement;Awareness;Problem solving  Current Attention Level: Sustained     Safety/Judgement: Decreased awareness of safety;Decreased awareness of deficits   Problem Solving: Slow processing General Comments: frequent cues for safety        Exercises      General Comments General  comments (skin integrity, edema, etc.): grandaughter in the room and noted plan for home with HHPT and family support,  Attempted to educate on assist on pt R side without pulling on the arm and need for 24 hour assist with pt's daughter and other grandaughter on facetime; also educated will have HHPT at d/c, has 2 steps to enter so discussed up with good leg first and bilateral UE support from family      Pertinent Vitals/Pain Pain Assessment: No/denies pain    Home Living                          Prior Function            PT Goals (current goals can now be found in the care plan section) Progress towards PT goals: Progressing toward goals    Frequency    Min 4X/week      PT Plan Discharge plan needs to be updated    Co-evaluation              AM-PAC PT "6 Clicks" Mobility   Outcome Measure  Help needed turning from your back to your side while in a flat bed without using bedrails?: A Lot Help needed moving from lying on your back to sitting on the side of a flat bed without using bedrails?: A Lot Help needed moving to and from a bed to a chair (including a wheelchair)?: A Lot Help needed standing up from a chair using your arms (e.g., wheelchair or bedside chair)?: A Lot Help needed to walk in hospital room?: A Lot Help needed climbing 3-5 steps with a railing? : Total 6 Click Score: 11    End of Session Equipment Utilized During Treatment: Gait belt Activity Tolerance: Patient limited by fatigue Patient left: in chair;with call bell/phone within reach;with family/visitor present;with chair alarm set   PT Visit Diagnosis: Other abnormalities of gait and mobility (R26.89);Hemiplegia and hemiparesis;Muscle weakness (generalized) (M62.81) Hemiplegia - Right/Left: Right Hemiplegia - dominant/non-dominant: Dominant Hemiplegia - caused by: Cerebral infarction     Time: [id number] PT Time Calculation (min) (ACUTE ONLY): 24 min  Charges:  $Gait Training:  8-22 mins $Self Care/Home Management: 8-22                     Magda Kiel, PT Acute Rehabilitation Services Pager:[phone] Office:[phone] 10/05/2021    Tamara Herring 10/05/2021, 5:31 PM

## 2021-10-06 ENCOUNTER — Other Ambulatory Visit (HOSPITAL_COMMUNITY): Payer: Self-pay

## 2021-10-06 LAB — GLUCOSE, CAPILLARY
Glucose-Capillary: 150 mg/dL — ABNORMAL HIGH (ref 70–99)
Glucose-Capillary: 152 mg/dL — ABNORMAL HIGH (ref 70–99)

## 2021-10-06 LAB — BASIC METABOLIC PANEL
Anion gap: 9 (ref 5–15)
BUN: 24 mg/dL — ABNORMAL HIGH (ref 8–23)
CO2: 23 mmol/L (ref 22–32)
Calcium: 8.7 mg/dL — ABNORMAL LOW (ref 8.9–10.3)
Chloride: 104 mmol/L (ref 98–111)
Creatinine, Ser: 2.02 mg/dL — ABNORMAL HIGH (ref 0.44–1.00)
GFR, Estimated: 25 mL/min — ABNORMAL LOW (ref >60)
Glucose, Bld: 130 mg/dL — ABNORMAL HIGH (ref 70–99)
Potassium: 3.3 mmol/L — ABNORMAL LOW (ref 3.5–5.1)
Sodium: 136 mmol/L (ref 135–145)

## 2021-10-06 MED ORDER — CLOPIDOGREL BISULFATE 75 MG PO TABS
75.0000 mg | ORAL_TABLET | Freq: Every day | ORAL | 0 refills | Status: AC
  Filled 2021-10-06: qty 30, 30d supply, fill #0

## 2021-10-06 MED ORDER — AMLODIPINE BESYLATE 10 MG PO TABS: 10.0000 mg | ORAL_TABLET | Freq: Every day | ORAL | Status: DC

## 2021-10-06 MED ORDER — AMLODIPINE BESYLATE 10 MG PO TABS
10.0000 mg | ORAL_TABLET | Freq: Every day | ORAL | 0 refills | Status: AC
  Filled 2021-10-06: qty 30, 30d supply, fill #0

## 2021-10-06 MED ORDER — ATORVASTATIN CALCIUM 40 MG PO TABS
40.0000 mg | ORAL_TABLET | Freq: Every day | ORAL | 0 refills | Status: AC
Start: 2021-10-07 — End: ?
  Filled 2021-10-06: qty 30, 30d supply, fill #0

## 2021-10-06 MED ORDER — POTASSIUM CHLORIDE 20 MEQ PO PACK
40.0000 meq | PACK | Freq: Once | ORAL | Status: AC
  Administered 2021-10-06: 40 meq via ORAL
  Filled 2021-10-06: qty 2

## 2021-10-06 MED ORDER — ENOXAPARIN SODIUM 30 MG/0.3ML IJ SOSY
30.0000 mg | PREFILLED_SYRINGE | INTRAMUSCULAR | Status: DC
  Administered 2021-10-06: 30 mg via SUBCUTANEOUS
  Filled 2021-10-06: qty 0.3

## 2021-10-06 NOTE — Discharge Summary (Signed)
Family Medicine Teaching [facility] Discharge Summary  Patient name: Tamara Herring Medical record number: [id number] Date of birth: 1945-12-28 Age: 76 y.o. Gender: female Date of Admission: 10/02/2021  Date of Discharge: 10/06/21 Admitting Physician: Shelby Mattocks, DO  Primary Care Provider: [name], FNP Consultants: Neurology  Indication for Hospitalization:  Stroke  Discharge Diagnoses/Problem List:  Stroke  Disposition: Home  Discharge Condition: Improved  Discharge Exam:  Temp:  [98.1 F (36.7 C)-98.8 F (37.1 C)] 98.5 F (36.9 C) (01/06 0322) Pulse Rate:  [74-125] 74 (01/06 0322) Resp:  [17-20] 18 (01/06 0322) BP: (163-191)/(78-95) 177/81 (01/06 0322) SpO2:  [96 %-100 %] 100 % (01/06 0322) Physical Exam: General: Well appearing, good mood, NAD, African American female, right sided facial droop, dysarthria Cardiovascular: RRR, NRMG Respiratory: CTABL Abdomen: Soft, NTTP, non-distended Extremities: weakness in RUE, RLE, strength intact in LUE and LLE. Peripheral pulses intact, cap refill < 2 sec   Brief Hospital Course:  Tamara Herring is a 76 y.o.female with a history of HTN, HLD, T2DM, GERD, CKD 3B, CAD with MI who was admitted to the [facility] Teaching Service at [facility] for facial droop, dysarthria, bilateral lower extremity weakness R>L. Her hospital course is detailed below:  CVA Patient presented with new onset right-sided facial droop, bilateral upper and lower extremity weakness R>L.  She presented greater than 24 hours after initial symptom onset.  EKG notable for sinus arrhythmia without ST elevation.  CT head unremarkable.  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  Neurology was consulted and recommended aspirin 81 mg daily, Plavix 75 mg daily x21 days.  This was then to be followed by aspirin 81 mg alone daily.  Vascular ultrasound of carotids showed right left ICA 1 to 39% stenosed.  Echo  showed LVEF 55 to 60% without regional wall motion abnormalities and no evidence of resting ischemia or old infarct but does present with severe concentric LVH.  Given facial droop and difficulty swallowing, SLP recommended acute inpatient rehab which is also echoed by PT recommendation. Patient denied inpatient rehab and elected to go home with home health. Patient has close family member who is PT and can work with patient.   HTN Neurology advised permissive hypertension in the setting of new onset CVA.  After 48 hours, patient was restarted on hypertensive medications, although unsure of patient's true home medications.  Patient started on amlodipine 10 mg.  Elevated Creatinine Patient admitted with elevated Ct to 1.67. Cr trended down, but then went back up after starting blood pressure medicine, lisinopril. Lisinopril was stopped and losartan was started. Cr. Continued to rise to 2.02, medication was promptly stopped, and patient was started on amlodipine 10 mg.   HLD   CAD with history of MI Lipid panel notable for LDL 97, HDL 53.  Started on atorvastatin 40 mg daily.  Given patient has significant cardiac history and risk factors, consider initiation of SGLT2 and weighing benefits and risks in the setting of kidney disease.  Anemia Questionable whether or not patient has component of anemia.  Ferrous sulfate 325 mg daily on her medication list.  Patient initially presented with hemoglobin 11.8.  No evidence of recent colonoscopy in chart discovery.  Recommend further work-up on outpatient basis given no history at this time of active bleeding.  PCP Follow-up Recommendations: Outpatient colonoscopy for anemia work-up Consider initiation of SGLT2 Medications discontinued at discharge and needs follow up: - Lasix 20 mg daily - oxybutynin 10 mg daily -  Klor-con 10 mEq 4.   Check BMP to f/u Cr    Significant Procedures: None  Significant Labs and Imaging:  Recent Labs  Lab  10/02/21 1151 10/02/21 1159 10/05/21 0228  WBC 7.6  --  8.2  HGB 11.8* 12.9 12.1  HCT 36.8 38.0 37.1  PLT 251  --  238   Recent Labs  Lab 10/02/21 1151 10/02/21 1159 10/03/21 1700 10/05/21 0228 10/06/21 0103  NA 135 138 136 134* 136  K 4.0 4.0 3.9 3.5 3.3*  CL 103 105 103 104 104  CO2 21*  --  21* 21* 23  GLUCOSE 137* 132* 144* 134* 130*  BUN 16 17 21 22  24*  CREATININE 1.56* 1.50* 1.68* 1.73* 2.02*  CALCIUM 8.8*  --  8.9 9.1 8.7*  ALKPHOS 77  --   --   --   --   AST 20  --   --   --   --   ALT 15  --   --   --   --   ALBUMIN 3.7  --   --   --   --       Results/Tests Pending at Time of Discharge: None  Discharge Medications:  Allergies as of 10/06/2021       Reactions   Codeine Swelling, Rash   Shrimp Extract Allergy Skin Test Swelling   Wound Dressing Adhesive Swelling   Nortriptyline Itching   Chocolate Flavor Rash        Medication List     STOP taking these medications    cyclobenzaprine 5 MG tablet Commonly known as: FLEXERIL   furosemide 20 MG tablet Commonly known as: LASIX   lisinopril 40 MG tablet Commonly known as: ZESTRIL   omeprazole 40 MG capsule Commonly known as: PRILOSEC   oxybutynin 10 MG 24 hr tablet Commonly known as: DITROPAN-XL   potassium chloride 10 MEQ tablet Commonly known as: KLOR-CON       TAKE these medications    amLODipine 10 MG tablet Commonly known as: NORVASC Take 1 tablet (10 mg total) by mouth daily. Start taking on: October 07, 2021   aspirin 81 MG EC tablet Take 81 mg by mouth daily.   atorvastatin 40 MG tablet Commonly known as: LIPITOR Take 1 tablet (40 mg total) by mouth daily. Start taking on: October 07, 2021   clopidogrel 75 MG tablet Commonly known as: PLAVIX Take 1 tablet (75 mg total) by mouth daily. Start taking on: October 07, 2021   gabapentin 100 MG capsule Commonly known as: NEURONTIN Take 100 mg by mouth 2 (two) times daily.   insulin aspart protamine - aspart (70-30) 100  UNIT/ML FlexPen Commonly known as: NOVOLOG 70/30 MIX Inject 30 Units into the skin See admin instructions. Inject 30 units into the skin in the morning, and inject 30 units under the skin at bedtime               Durable Medical Equipment  (From admission, onward)           Start     Ordered   10/05/21 1055  For home use only DME 3 n 1  Once        10/05/21 1054   10/05/21 1055  For home use only DME Walker rolling  Once       Question Answer Comment  Walker: With 5 Inch Wheels   Patient needs a walker to treat with the following condition Stroke (HCC)  10/05/21 1054            Discharge Instructions: Please refer to Patient Instructions section of EMR for full details.  Patient was counseled important signs and symptoms that should prompt return to medical care, changes in medications, dietary instructions, activity restrictions, and follow up appointments.   Follow-Up Appointments:  Follow-up Information     [facility]   Specialty: Home Health Services Why: The home health agency will contact you for the first home visit. Contact information: [address] [place] [address] [phone]                 Bess KindsSowell, Tynleigh Birt, MD 10/06/2021, 7:53 PM PGY-1, [facility]

## 2021-10-06 NOTE — Discharge Instructions (Signed)
Dear Tamara Herring,  Thank you for letting us participate in your care.   POST-HOSPITAL & CARE INSTRUCTIONS We have made several medication changes. Please review to medication list and follow up with your doctor Go to your follow up appointments (listed below)   DOCTOR'S APPOINTMENT   No future appointments.  Follow-up Information     Care, [facility] Follow up.   Specialty: Home Health Services Why: The home health agency will contact you for the first home visit. Contact information: [address] [place] [address] [phone]                 Take care and be well!  Family Medicine Teaching Service Inpatient Team [place]  [facility]  [address] [place], [place] [address] [phone]

## 2021-10-06 NOTE — Care Management Important Message (Signed)
Important Message  Patient Details  Name: Tamara Herring MRN: [id number] Date of Birth: [birth year]   Medicare Important Message Given:  Yes     Orbie Pyo 10/06/2021, 3:13 PM

## 2021-10-06 NOTE — Progress Notes (Signed)
Pt wheeled off unit. All pts belongings were brought home by the family a day prior.

## 2021-10-06 NOTE — TOC Transition Note (Signed)
Transition of Care [facility]) - CM/SW Discharge Note   Patient Details  Name: Gredmarie Delange MRN: [id number] Date of Birth: July 25, 1946  [facility] of [facility]) CM/SW Contact:  Kermit Balo, RN Phone Number: 10/06/2021, 1:32 PM   Clinical Narrative:    Patient is discharging home with home heath services through [place]. Cory with [facility] aware of d/c home. DME for home has been delivered to the room.  Pt has supervision at home and transport to home. Medications for home to be delivered to the room per [facility] pharmacy.    Final next level of care: Home w Home Health Services Barriers to Discharge: No Barriers Identified   Patient Goals and CMS Choice   CMS Medicare.gov Compare Post Acute Care list provided to:: Patient Represenative (must comment) Choice offered to / list presented to : Adult Children, Patient  Discharge Placement                       Discharge Plan and Services   Discharge Planning Services: CM Consult Post Acute Care Choice: Home Health, Durable Medical Equipment          DME Arranged: 3-N-1, Walker rolling         HH Arranged: PT, OT, Speech Therapy, Nurse's Aide HH Agency: [facility] Date [facility] Agency Contacted: 10/05/21   Representative spoke with at [facility] Agency: Kandee Keen  Social Determinants of Health (SDOH) Interventions     Readmission Risk Interventions No flowsheet data found.

## 2021-10-06 NOTE — Progress Notes (Signed)
PT Cancellation Note  Patient Details Name: Tamara Herring MRN: [id number] DOB: 10-06-1945   Cancelled Treatment:    Reason Eval/Treat Not Completed: Other (comment); attempted early and pt eating breakfast and waiting on family to arrive.  Now pt in wheelchair for discharge.  Encouraged to wait for family to assist at home to avoid falls and ensured gait belt in bag for home.    Elray Mcgregor 10/06/2021, 2:39 PM Sheran Lawless, PT Acute Rehabilitation Services [phone] [phone] 10/06/2021

## 2021-10-06 NOTE — Plan of Care (Signed)
  Problem: Clinical Measurements: Goal: Diagnostic test results will improve Outcome: Progressing Goal: Respiratory complications will improve Outcome: Progressing   Problem: Activity: Goal: Risk for activity intolerance will decrease Outcome: Progressing   Problem: Nutrition: Goal: Adequate nutrition will be maintained Outcome: Progressing   

## 2021-10-06 NOTE — TOC Initial Note (Signed)
Transition of Care [facility]) - Initial/Assessment Note    Patient Details  Name: Tamara Herring MRN: [id number] Date of Birth: 1946/04/22  [facility]) CM/SW Contact:    Pollie Friar, RN Phone Number: 10/06/2021, 8:12 AM  Clinical Narrative:                 CM received information yesterday that patient wants to d/c home with family and home health services. CM met with the patient and her son at the bedside. Pt also in agreement with me calling her daughter. They all are in agreement with home with home health. They had no preference on home health agency. Home health arranged with [facility]. Information on the AVS. DME for home ordered through [place] and will be delivered to the room.  Pt has 24 hour support at home and family will over see her medications. Family also able to provide needed support.  TOC following.  Expected Discharge Plan: [place] Barriers to Discharge: Continued Medical Work up   Patient Goals and CMS Choice   CMS Medicare.gov Compare Post Acute Care list provided to:: Patient Represenative (must comment) Choice offered to / list presented to : Adult Children, Patient  Expected Discharge Plan and Services Expected Discharge Plan: [place]   Discharge Planning Services: CM Consult Post Acute Care Choice: Home Health, Durable Medical Equipment Living arrangements for the past 2 months: Single Family Home                 DME Arranged: 3-N-1, Walker rolling         HH Arranged: PT, OT, Speech Therapy, Nurse's Aide HH Agency: [place] Date [facility] Agency Contacted: 10/05/21   Representative spoke with at [place]: Tommi Rumps  Prior Living Arrangements/Services Living arrangements for the past 2 months: [place] Lives with:: Adult Children Patient language and need for interpreter reviewed:: Yes Do you feel safe going back to the place where you live?: Yes      Need for Family  Participation in Patient Care: Yes (Comment) Care giver support system in place?: Yes (comment) Current home services: DME (rollator/ wheelchair) Criminal Activity/Legal Involvement Pertinent to Current Situation/Hospitalization: No - Comment as needed  Activities of Daily Living      Permission Sought/Granted                  Emotional Assessment Appearance:: Appears stated age Attitude/Demeanor/Rapport: Engaged Affect (typically observed): Accepting     Psych Involvement: No (comment)  Admission diagnosis:  Right sided weakness [R53.1] Cerebrovascular accident (CVA), unspecified mechanism ([place]) [I63.9] Patient Active Problem List   Diagnosis Date Noted   Cerebral thrombosis with cerebral infarction 10/04/2021   Cerebrovascular accident (CVA) [facility])    Right sided weakness 10/02/2021   PCP:  Karie Mainland, [place] Pharmacy:   [facility] [address] [place] [address] Phone: [phone] Fax: [phone]  [facility] [address] [place] [address] Phone: [phone] Fax: [phone]     Social Determinants of Health (SDOH) Interventions    Readmission Risk Interventions No flowsheet data found.

## 2021-10-09 NOTE — Patient Outreach (Signed)
Received a red flag Emmi stroke notification for Tamara Herring.  I have assigned Marval Regal, RN to call for follow up and determine if there are any Case Management needs.    Iverson Alamin, Donivan Scull [facility] Care Management Assistant [facility] Care Management [phone]

## 2021-10-10 ENCOUNTER — Other Ambulatory Visit: Payer: Self-pay

## 2021-10-10 ENCOUNTER — Encounter: Payer: Self-pay | Admitting: *Deleted

## 2021-10-10 ENCOUNTER — Other Ambulatory Visit: Payer: Self-pay | Admitting: *Deleted

## 2021-10-10 NOTE — Patient Outreach (Signed)
[facility]) Care Management Telephonic RN Care Manager Note   10/10/2021 Name:  Tamara Herring MRN:  [id number] DOB:  08/24/1946  Summary: EMMI stroke red alert outreach for red alert on Sunday 10/08/21 10 am indicating patient was feeling worse overall? yes  Per daughter Silva Bandy, EMMI red alert related to BP elevated during a [facility] home health nurse visit but resolved by the end of the visit  With further assessment she also discusses  Falls reported at home when pt tried to get to restroom independently without daughters' assistance   New symptom -Running nose, tearing, sneezing for last 2 days, denies sick/covid contact No bowel movement in a few days - report last stool hard small ball, patient strained to eliminate  Recommendations/Changes made from today's visit: Assisted with conference outreach to [facility] to have the on call service to call daughter phyllis with pending visit update that they were informed they would receive today Recommend put bedside commode/3 n 1 close by Recommend virus precautions until attendance to scheduled 10/14/21 pcp appointment for evaluation Suggested bowel elimination over the counter (OTC) medicine use with consideration for dysarthria, facial droop, swallowing difficulty and need of fluid thickener Provided RN CM and 24 hour nurse call center numbers for outreach prn    Subjective: Tamara Herring is an 76 y.o. year old female who is a primary patient of Karie Mainland, [place]. The care management team was consulted for assistance with care management and/or care coordination needs.   She was referred to [facility] on 10/09/21 for EMMI stroke red alert  She discharged from [facility] on 10/06/21, admitted 10/03/21 Dx Cerebral thrombosis with cerebral infarction  Telephonic RN Care Manager completed Telephone Visit today.   Objective:  Medications Reviewed Today     Reviewed by Eliseo Squires, CPhT (Pharmacy Technician) on 10/02/21 at 1753   Med List Status: Complete   Medication Order Taking? Sig Documenting Provider Last Dose Status Informant  aspirin 81 MG EC tablet [id number] Yes Take 81 mg by mouth daily. [provider] 10/01/2021 Active Self  cyclobenzaprine (FLEXERIL) 5 MG tablet [id number] Yes Take 5 mg by mouth daily as needed for muscle spasms. [provider] Past Week Active Self  furosemide (LASIX) 20 MG tablet [id number] Yes Take 20 mg by mouth daily. [provider] 10/01/2021 Active Self  gabapentin (NEURONTIN) 100 MG capsule [id number] Yes Take 100 mg by mouth 2 (two) times daily. [provider] 10/01/2021 Active Self  insulin aspart protamine - aspart (NOVOLOG 70/30 MIX) (70-30) 100 UNIT/ML FlexPen [id number] Yes Inject 30 Units into the skin See admin instructions. Inject 30 units into the skin in the morning, and inject 30 units under the skin at bedtime [provider] 10/02/2021 Active Self  lisinopril (ZESTRIL) 40 MG tablet [id number] Yes Take 40 mg by mouth daily. [provider] 10/02/2021 Active Self  omeprazole (PRILOSEC) 40 MG capsule [id number] Yes Take 40 mg by mouth daily. [provider] 10/01/2021 Active Self  oxybutynin (DITROPAN-XL) 10 MG 24 hr tablet [id number] Yes Take 10 mg by mouth daily. [provider] 10/01/2021 Active Self  potassium chloride (KLOR-CON) 10 MEQ tablet [id number] Yes Take 10 mEq by mouth daily. [provider] 10/01/2021 Active Self            Patient Active Problem List   Diagnosis Date Noted   Cerebral thrombosis with cerebral infarction 10/04/2021   Cerebrovascular accident (CVA) [facility])    Right sided weakness 10/02/2021   Gout 05/02/2020  Osteoarthritis 05/02/2020   Proteinuria 05/02/2020   Hypokalemia 04/26/2020   Obesity 04/26/2020   Type 2 diabetes mellitus with other specified complication ([place]) [id number]   Vitamin D deficiency 04/26/2020   Headache, unspecified 09/29/2018   Vertigo 09/29/2018    Acute hyponatremia 06/08/2017   Hypo-osmolality and hyponatremia 06/08/2017   Current use of insulin ([place]) 11/20/2016   Presence of intraocular lens 11/20/2016   Unspecified astigmatism, bilateral 11/20/2016   Localized enlarged lymph nodes 04/01/2016   Other abnormal and inconclusive findings on diagnostic imaging of breast 03/22/2016   Bilateral primary osteoarthritis of knee 02/04/2016   Disorder of upper respiratory system 02/04/2016   Hyperlipidemia 02/04/2016   Otalgia, bilateral 02/04/2016   Acute renal failure ([place]) 11/25/2015   Essential hypertension 11/25/2015   Leukocytosis 11/25/2015   Syncope 11/25/2015   Cataract 03/02/2015   Past Medical History:  Diagnosis Date   Diabetes mellitus without complication ([place])    Hypertension   Hyperlipidemia, GERD, CKD 3 B, CAD with MI  SDOH:  (Social Determinants of Health) assessments and interventions performed:  SDOH Interventions    Flowsheet Row Most Recent Value  SDOH Interventions   Food Insecurity Interventions Intervention Not Indicated  Financial Strain Interventions Intervention Not Indicated  Housing Interventions Intervention Not Indicated  Intimate Partner Violence Interventions Intervention Not Indicated  Stress Interventions Intervention Not Indicated  Social Connections Interventions Intervention Not Indicated  Transportation Interventions Intervention Not Indicated       Care Plan  Review of patient past medical history, allergies, medications, health status, including review of consultants reports, laboratory and other test data, was performed as part of comprehensive evaluation for care management services.   Care Plan : General Nursing  (Adult)  Updates made by Barbaraann Faster, RN since 10/10/2021 12:00 AM     Problem: CHL AMB "PATIENT-SPECIFIC PROBLEM"      Goal: Establish Plan of Care for Management Complex SDOH Barriers, disease management and Care Coordination Needs in patient with stroke HTN    Start Date: 10/10/2021  This Visit's Progress: On track  Priority: High  Note:   Current Barriers:  Knowledge Deficits related to plan of care for management of stroke, HTN  Care Coordination needs related to Limited education about stroke, HTN* dysarthric speech. R facial droop and mild right upper & lower extremity weakness. Dx=acute infarct L lentifomr nucleus adn corona radiata.  PMH significant for DM2, HTN, HLD, CKD3b, GERD.  RNCM Clinical Goal(s):  Patient will verbalize feeling better vs worse overall (EMMI stroke red alert) through collaboration with RN Care manager, provider, and care team.   Interventions:  Provide resource.,Care coordination, disease management as needed for EMMI stroke red alert  Inter-disciplinary care team collaboration (see longitudinal plan of care) Evaluation of current treatment plan related to  self management and patient's adherence to plan as established by provider   Stroke:  (Status:New goal.) Short Term Goal Reviewed Importance of attending all scheduled provider appointments Reviewed referrals to home health Recommendations/Changes made from today's visit: Assisted with conference outreach to [facility] to have the on call service to call daughter phyllis with pending visit update that they were informed they would receive today Recommend put bedside commode/3 n 1 close by Recommend virus precautions until attendance to scheduled 10/14/21 pcp appointment for evaluation Suggested bowel elimination over the counter (OTC) medicine use with consideration for dysarthria, facial droop, swallowing difficulty and need of fluid thickener Provided RN CM and 24 hour nurse call center numbers for outreach prn  Patient Goals/Self-Care Activities: Take all medications as prescribed Call provider office for new concerns or questions  Take OTC bowel elimination medication, monitor BP at home, purchase BP if needed using [employer] OTC program, outreach to 24 hr RN, RN CM  prn, keep 3 n 1 close to bed to assist with safety/prevention of falls call doctor for signs and symptoms of high blood pressure report new symptoms to your doctor  Follow Up Plan:  The patient has been provided with contact information for the care management team and has been advised to call with any health related questions or concerns.  The care management team will reach out to the patient again over the next 30 business  days.       Plan: The patient has been provided with contact information for the care management team and has been advised to call with any health related questions or concerns.  The care management team will reach out to the patient again over the next 30 business days.  Hayleen Clinkscales L. Lavina Hamman, RN, BSN, [place] Coordinator Office number [phone] Main [facility] number [phone] Fax number [phone]

## 2021-10-11 ENCOUNTER — Other Ambulatory Visit: Payer: Self-pay | Admitting: *Deleted

## 2021-10-11 NOTE — Patient Outreach (Signed)
[facility]) Care Management Telephonic RN Care Manager Note   10/11/2021 Name:  Tamara Herring MRN:  [id number] DOB:  05-Sep-1946  Summary: RN CM follow outreach for home health services Home health outreached on 10/11/21 and arrived on 10/11/21  Further assessment Pcp not seen in December 2022 related to coverage issues Last on August 21 2021  No follow neurologist visit No stool yet   Recommendations/Changes made from today's visit: RN CM recommended pt/family follow discharge instructions and obtain aspirin 81 mg RN CM discussed importance of anticoagulant r/t stroke- understanding voiced RN CM reviewed other medicines RN CM encouraged confirmed no neurology follow up appointment scheduled, discussed neurology importance and encouraged discussion with pcp during upcoming hospital follow visit    Subjective: Tamara Herring is an 76 y.o. year old female who is a primary patient of Tamara Herring, [place]. The care management team was consulted for assistance with care management and/or care coordination needs.    She was referred to [facility] on 10/09/21 for EMMI stroke red alert  She discharged from [facility] on 10/06/21, admitted 10/03/21 Dx Cerebral thrombosis with cerebral infarction  Telephonic RN Care Manager completed Telephone Visit today.   Objective:  Medications Reviewed Today     Reviewed by Donalynn Furlong, CPhT (Pharmacy Technician) on 10/02/21 at 1753  Med List Status: Complete   Medication Order Taking? Sig Documenting Provider Last Dose Status Informant  aspirin 81 MG EC tablet [id number] Yes Take 81 mg by mouth daily. [provider] 10/01/2021 Active Self  cyclobenzaprine (FLEXERIL) 5 MG tablet [id number] Yes Take 5 mg by mouth daily as needed for muscle spasms. [provider] Past Week Active Self  furosemide (LASIX) 20 MG tablet [id number] Yes Take 20 mg by mouth daily. [provider] 10/01/2021 Active Self  gabapentin (NEURONTIN)  100 MG capsule [id number] Yes Take 100 mg by mouth 2 (two) times daily. [provider] 10/01/2021 Active Self  insulin aspart protamine - aspart (NOVOLOG 70/30 MIX) (70-30) 100 UNIT/ML FlexPen [id number] Yes Inject 30 Units into the skin See admin instructions. Inject 30 units into the skin in the morning, and inject 30 units under the skin at bedtime [provider] 10/02/2021 Active Self  lisinopril (ZESTRIL) 40 MG tablet [id number] Yes Take 40 mg by mouth daily. [provider] 10/02/2021 Active Self  omeprazole (PRILOSEC) 40 MG capsule [id number] Yes Take 40 mg by mouth daily. [provider] 10/01/2021 Active Self  oxybutynin (DITROPAN-XL) 10 MG 24 hr tablet [id number] Yes Take 10 mg by mouth daily. [provider] 10/01/2021 Active Self  potassium chloride (KLOR-CON) 10 MEQ tablet [id number] Yes Take 10 mEq by mouth daily. [provider] 10/01/2021 Active Self             SDOH:  (Social Determinants of Health) assessments and interventions performed:    Care Plan  Review of patient past medical history, allergies, medications, health status, including review of consultants reports, laboratory and other test data, was performed as part of comprehensive evaluation for care management services.   There are no care plans that you recently modified to display for this patient.    Plan: The patient has been provided with contact information for the care management team and has been advised to call with any health related questions or concerns.  The care management team will reach out to the patient again over the next 30 business days.  Jacquette Canales L. Noelle Penner, RN, BSN, CCM [facility] Telephonic Care Management Care Coordinator  Office number [phone] Main [facility] number [phone] Fax number [phone]

## 2021-10-18 ENCOUNTER — Telehealth (HOSPITAL_COMMUNITY): Payer: Self-pay

## 2021-10-18 ENCOUNTER — Other Ambulatory Visit (HOSPITAL_COMMUNITY): Payer: Self-pay

## 2021-10-18 NOTE — Telephone Encounter (Signed)
Pharmacy Transitions of Care Follow-up Telephone Call  Date of discharge: 10/06/21    Discharge Diagnosis: CVA  How have you been since you were released from the hospital? Tamara Herring is feeling better.    Medication changes made at discharge:  - START: Plavix, Aspirin,Atorvastatin, Amlodipine   - STOPPED: Lisinopril, Flexeril, Furosemide, Oxybutynin,Potassium,Omeprazole  - CHANGED:   Medication changes verified by the patient? Yes   Medication Accessibility:  [facility] Pharm[facility]   Was the patient provided with refills on discharged medications? No   Have all prescriptions been transferred from [facility] to home pharmacy? No   Is the patient able to afford medications? Yes Notable copays:  Eligible patient assistance:     Medication Review:    CLOPIDOGREL (PLAVIX) Clopidogrel 75 mg once daily.  - Educated patient on expected duration of therapy of  with clopidogrel. Advised patient that aspirin will be continued indefinitely.  - Reviewed potential DDIs with patient  - Advised patient of medications to avoid (NSAIDs, ASA)  - Educated that Tylenol (acetaminophen) will be the preferred analgesic to prevent risk of bleeding  - Emphasized importance of monitoring for signs and symptoms of bleeding (abnormal bruising, prolonged bleeding, nose bleeds, bleeding from gums, discolored urine, black tarry stools)  - Advised patient to alert all providers of anticoagulation therapy prior to starting a new medication or having a procedure    Follow-up Appointments:  PCP Hospital f/u appt confirmed? Pt states she has been to a follow up appt.   If their condition worsens, is the pt aware to call PCP or go to the Emergency Dept.? yes  Final Patient Assessment: Tamara Herring is feeling okay. She is still weak but is receiving physical therapy. She states she doesn't have anymore pain medication for her back. I advised her that was not on her discharge list and she should call her regular  doctor to ask about getting a prescription. I could not determine what medication she was referring to. She is not taking any over the counter pain relievers.

## 2021-10-23 ENCOUNTER — Other Ambulatory Visit: Payer: Self-pay | Admitting: *Deleted

## 2021-10-23 NOTE — Patient Outreach (Signed)
[facility]) Care Management Telephonic RN Care Manager Note   10/23/2021 Name:  Tamara Herring MRN:  [id number] DOB:  1946-04-05  Summary: EMMI stroke red alert follow up for an outreach response on Friday 10/20/21 1002 for Feeling worse overall? Yes Tamara Herring, daughter and patient Tamara Herring Pt with some dysarthria answered questions and reports patient is doing well today  Denies worsening symptoms or other needs Reports pcp provided orders for neurology follow up in [place] Pt had a good stool, resolved constipation issues  Recommendations/Changes made from today's visit: See care plan below   Subjective: Tamara Herring is an 76 y.o. year old female who is a primary patient of Tamara Herring, [place]. The care management team was consulted for assistance with care management and/or care coordination needs.    Telephonic RN Care Manager completed Telephone Visit today.   Objective:  Medications Reviewed Today     Reviewed by Donalynn Furlong, CPhT (Pharmacy Technician) on 10/02/21 at 1753  Med List Status: Complete   Medication Order Taking? Sig Documenting Provider Last Dose Status Informant  aspirin 81 MG EC tablet [id number] Yes Take 81 mg by mouth daily. [provider] 10/01/2021 Active Self  cyclobenzaprine (FLEXERIL) 5 MG tablet [id number] Yes Take 5 mg by mouth daily as needed for muscle spasms. [provider] Past Week Active Self  furosemide (LASIX) 20 MG tablet [id number] Yes Take 20 mg by mouth daily. [provider] 10/01/2021 Active Self  gabapentin (NEURONTIN) 100 MG capsule [id number] Yes Take 100 mg by mouth 2 (two) times daily. [provider] 10/01/2021 Active Self  insulin aspart protamine - aspart (NOVOLOG 70/30 MIX) (70-30) 100 UNIT/ML FlexPen [id number] Yes Inject 30 Units into the skin See admin instructions. Inject 30 units into the skin in the morning, and inject 30 units under the skin at bedtime [provider] 10/02/2021 Active Self  lisinopril (ZESTRIL) 40 MG tablet [id number] Yes Take 40 mg by mouth daily. [provider] 10/02/2021 Active Self  omeprazole (PRILOSEC) 40 MG capsule [id number] Yes Take 40 mg by mouth daily. [provider] 10/01/2021 Active Self  oxybutynin (DITROPAN-XL) 10 MG 24 hr tablet [id number] Yes Take 10 mg by mouth daily. [provider] 10/01/2021 Active Self  potassium chloride (KLOR-CON) 10 MEQ tablet [id number] Yes Take 10 mEq by mouth daily. [provider] 10/01/2021 Active Self           Patient Active Problem List   Diagnosis Date Noted   Cerebral thrombosis with cerebral infarction 10/04/2021   Cerebrovascular accident (CVA) (HCC)    Right sided weakness 10/02/2021   Gout 05/02/2020   Osteoarthritis 05/02/2020   Proteinuria 05/02/2020   Hypokalemia 04/26/2020   Obesity 04/26/2020   Type 2 diabetes mellitus with other specified complication (HCC) 04/26/2020   Vitamin D deficiency 04/26/2020   Headache, unspecified 09/29/2018   Vertigo 09/29/2018   Acute hyponatremia 06/08/2017   Hypo-osmolality and hyponatremia 06/08/2017   Current use of insulin (HCC) 11/20/2016   Presence of intraocular lens 11/20/2016   Unspecified astigmatism, bilateral 11/20/2016   Localized enlarged lymph nodes 04/01/2016   Other abnormal and inconclusive findings on diagnostic imaging of breast 03/22/2016   Bilateral primary osteoarthritis of knee 02/04/2016   Disorder of upper respiratory system 02/04/2016   Hyperlipidemia 02/04/2016   Otalgia, bilateral 02/04/2016   Acute renal failure (HCC) 11/25/2015   Essential hypertension 11/25/2015   Leukocytosis 11/25/2015   Syncope 11/25/2015   Cataract 03/02/2015  SDOH:  (Social Determinants of Health) assessments and interventions performed:    Care Plan  Review of patient past medical history, allergies, medications, health status, including review of consultants reports,  laboratory and other test data, was performed as part of comprehensive evaluation for care management services.   There are no care plans that you recently modified to display for this patient.    Plan: The patient has been provided with contact information for the care management team and has been advised to call with any health related questions or concerns.  No further follow up required: EMMI stroke no further needs case closure, ineligible for further services  Loida Calamia L. Lavina Hamman, RN, BSN, [place] Coordinator Office number [phone] Main [facility] number [phone] Fax number [phone]

## 2021-10-27 NOTE — Patient Outreach (Signed)
Received the following Nurse call center notification for Ms. [place]ad[place].  Synopsis of Call: [birth year] Caller states her mother is having a hard time voiding and emptying bowels.  Patient reports urgency but cannot eliminate when she goes to the restroom.  She also reports abdominal pain and a "full" bladder sensation.  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  They also tried manual removal of stool but could not remove anything.  Patient was previously on Oxybutynin but was take off of it when she was hospitalized for a stroke (d/c 01/06).  Patient reports last void at 0800 today (10 hours ago).  Last BM unknown/unclear.  They also state patient saw her doctor yesterday and they told her to go to the ED if her elimination issues continued.  Advised caller per protocol below and to call back with questions.  Multiple (2) protocols were used on this call. Disposition for Call: Go to ED Now  I have assigned Marval Regal, RN to call for follow up and determine if there are any Care Coordination needs.    Iverson Alamin, Donivan Scull [facility] Care Management Assistant [facility] Care Management [phone]

## 2021-10-30 ENCOUNTER — Other Ambulatory Visit: Payer: Self-pay | Admitting: *Deleted

## 2021-10-30 NOTE — Patient Outreach (Signed)
[facility]) Care Management Telephonic RN Care Manager Note   10/30/2021 Name:  Tamara Herring MRN:  [id number] DOB:  13-Jun-1946  Summary: Follow up with patient after an outreach on 10/26/21 to 24 hour nurse call center and ED visit for constipation and urinary retention [place] report stated daughter called reporting hard time voiding and emptying bowels with urgency, abdominal pain. Oxybutynin was stopped during 1/3-03/2022 admission.  Interventions unsuccessful included prune juice, miralax, manual removal Recommended to go to ED   Spoke with Tamara Herring with [facility], son present Pt confirms foley remains intact, no return call from urology for ED follow up visit yet from [facility] [phone]- Family called on 10/27/21  She confirms she has a history of constipation She confirms she is less mobile and needs help to ambulate Noted a neurology appointment pending for 01/03/2022 with Lirim Tonuzi   Recommendations/Changes made from today's visit: Assessed for worsening symptoms Discussed the importance of her dysphagia 2 honey thick liquid diet to prevent aspiration Discuss pathophysiology of constipation and urinary retention related to her intake of a dysphagia 2 honey thicken liquid diet Discussed with her that constipation is generally related to in adequate fluid, fiber intake and decreased activity Encouraged increase mobility, fluids  Subjective: Tamara Herring is an 76 y.o. year old female who is a primary patient of Tamara Herring, [place]. The care management team was consulted for assistance with care management and/or care coordination needs.    Telephonic RN Care Manager completed Telephone Visit today.   Objective:  Medications Reviewed Today     Reviewed by Donalynn Furlong, CPhT (Pharmacy Technician) on 10/02/21 at 1753  Med List Status: Complete   Medication Order Taking? Sig Documenting Provider Last Dose Status Informant  aspirin 81 MG  EC tablet [id number] Yes Take 81 mg by mouth daily. [provider] 10/01/2021 Active Self  cyclobenzaprine (FLEXERIL) 5 MG tablet [id number] Yes Take 5 mg by mouth daily as needed for muscle spasms. [provider] Past Week Active Self  furosemide (LASIX) 20 MG tablet [id number] Yes Take 20 mg by mouth daily. [provider] 10/01/2021 Active Self  gabapentin (NEURONTIN) 100 MG capsule [id number] Yes Take 100 mg by mouth 2 (two) times daily. [provider] 10/01/2021 Active Self  insulin aspart protamine - aspart (NOVOLOG 70/30 MIX) (70-30) 100 UNIT/ML FlexPen [id number] Yes Inject 30 Units into the skin See admin instructions. Inject 30 units into the skin in the morning, and inject 30 units under the skin at bedtime [provider] 10/02/2021 Active Self  lisinopril (ZESTRIL) 40 MG tablet [id number] Yes Take 40 mg by mouth daily. [provider] 10/02/2021 Active Self  omeprazole (PRILOSEC) 40 MG capsule [id number] Yes Take 40 mg by mouth daily. [provider] 10/01/2021 Active Self  oxybutynin (DITROPAN-XL) 10 MG 24 hr tablet [id number] Yes Take 10 mg by mouth daily. [provider] 10/01/2021 Active Self  potassium chloride (KLOR-CON) 10 MEQ tablet [id number] Yes Take 10 mEq by mouth daily. [provider] 10/01/2021 Active Self           Patient Active Problem List   Diagnosis Date Noted   Cerebral thrombosis with cerebral infarction 10/04/2021   Cerebrovascular accident (CVA) [facility])    Right sided weakness 10/02/2021   Gout 05/02/2020   Osteoarthritis 05/02/2020   Proteinuria 05/02/2020   Hypokalemia 04/26/2020   Obesity 04/26/2020   Type 2 diabetes mellitus with other specified complication (HCC) 04/26/2020  Vitamin D deficiency 04/26/2020   Headache, unspecified 09/29/2018   Vertigo 09/29/2018   Acute hyponatremia 06/08/2017   Hypo-osmolality and hyponatremia 06/08/2017   Current use of insulin ([place]) 11/20/2016    Presence of intraocular lens 11/20/2016   Unspecified astigmatism, bilateral 11/20/2016   Localized enlarged lymph nodes 04/01/2016   Other abnormal and inconclusive findings on diagnostic imaging of breast 03/22/2016   Bilateral primary osteoarthritis of knee 02/04/2016   Disorder of upper respiratory system 02/04/2016   Hyperlipidemia 02/04/2016   Otalgia, bilateral 02/04/2016   Acute renal failure ([place]) 11/25/2015   Essential hypertension 11/25/2015   Leukocytosis 11/25/2015   Syncope 11/25/2015   Cataract 03/02/2015   Past Medical History:  Diagnosis Date   Diabetes mellitus without complication ([place])    Hypertension       SDOH:  (Social Determinants of Health) assessments and interventions performed:    Care Plan  Review of patient past medical history, allergies, medications, health status, including review of consultants reports, laboratory and other test data, was performed as part of comprehensive evaluation for care management services.   There are no care plans that you recently modified to display for this patient.    Plan: The patient has been provided with contact information for the care management team and has been advised to call with any health related questions or concerns.  The care management team will reach out to the patient again over the next 30+ business days.  Pilar Westergaard L. Lavina Hamman, RN, BSN, [place] Coordinator Office number [phone] Main [facility] number [phone] Fax number [phone]

## 2021-11-01 ENCOUNTER — Other Ambulatory Visit: Payer: Self-pay

## 2021-11-06 ENCOUNTER — Other Ambulatory Visit: Payer: Self-pay

## 2021-11-06 ENCOUNTER — Encounter (HOSPITAL_BASED_OUTPATIENT_CLINIC_OR_DEPARTMENT_OTHER): Payer: Self-pay | Admitting: *Deleted

## 2021-11-06 ENCOUNTER — Emergency Department (HOSPITAL_BASED_OUTPATIENT_CLINIC_OR_DEPARTMENT_OTHER)
Admission: EM | Admit: 2021-11-06 | Discharge: 2021-11-06 | Disposition: A | Discharge status: Home / Self Care | Payer: Medicare Other | Attending: Emergency Medicine | Admitting: Emergency Medicine

## 2021-11-06 DIAGNOSIS — N898 Other specified noninflammatory disorders of vagina: Secondary | ICD-10-CM | POA: Diagnosis present

## 2021-11-06 DIAGNOSIS — N390 Urinary tract infection, site not specified: Secondary | ICD-10-CM | POA: Insufficient documentation

## 2021-11-06 LAB — URINALYSIS, ROUTINE W REFLEX MICROSCOPIC
Bilirubin Urine: NEGATIVE
Glucose, UA: NEGATIVE mg/dL
Ketones, ur: NEGATIVE mg/dL
Nitrite: POSITIVE — AB
Protein, ur: >300 mg/dL — AB
Specific Gravity, Urine: 1.02 (ref 1.005–1.030)
pH: 5.5 (ref 5.0–8.0)

## 2021-11-06 LAB — URINALYSIS, MICROSCOPIC (REFLEX)
RBC / HPF: >50 RBC/hpf (ref 0–5)
WBC, UA: >50 WBC/hpf (ref 0–5)

## 2021-11-06 MED ORDER — CEPHALEXIN 500 MG PO CAPS: 500.0000 mg | ORAL_CAPSULE | Freq: Three times a day (TID) | ORAL | 0 refills | Status: AC

## 2021-11-06 MED ORDER — LIDOCAINE HCL (PF) 1 % IJ SOLN
INTRAMUSCULAR | Status: AC
  Administered 2021-11-06: 5 mL
  Filled 2021-11-06: qty 5

## 2021-11-06 MED ORDER — CEFTRIAXONE SODIUM 1 G IJ SOLR
1.0000 g | Freq: Once | INTRAMUSCULAR | Status: AC
Start: 2021-11-06 — End: 2021-11-06
  Administered 2021-11-06: 1 g via INTRAMUSCULAR
  Filled 2021-11-06: qty 10

## 2021-11-06 NOTE — Discharge Instructions (Signed)
Follow-up with your urologist this week.  However if you get fevers or pain return immediately back to the ER.  Return immediately back to the ER if:  Your symptoms worsen within the next 12-24 hours. You develop new symptoms such as new fevers, persistent vomiting, new pain, shortness of breath, or new weakness or numbness, or if you have any other concerns.

## 2021-11-06 NOTE — ED Notes (Signed)
Attempted to get pt's urine reading but machine was malfunctioning. Will check again in 30 mins

## 2021-11-06 NOTE — ED Triage Notes (Signed)
She has a foley catheter that is draining some blood noted in the urine collection bag as well as external on the pad she sits on. She has pain in her urethra.

## 2021-11-06 NOTE — ED Notes (Signed)
Pt. Here due to her F/C being blocked with thick contents like sludge that smells.  Pt. Reports she is unable to pee past the hard stuff in the bag.   RN changed the leg bag per family members request.

## 2021-11-06 NOTE — ED Provider Notes (Signed)
[facility] EMERGENCY DEPARTMENT Provider Note   CSN: [id number] Arrival date & time: 11/06/21  1711     History  No chief complaint on file.   Tamara Herring is a 76 y.o. female.  Patient presents with chief complaint of noticing blood in her Foley catheter bag.  Describes it as faint pink discoloration.  Has some burning sensation in her vagina.  Otherwise no flank pain no back pain no abdominal pain.  Denies any fevers or cough or vomiting or diarrhea.      Home Medications Prior to Admission medications   Medication Sig Start Date End Date Taking? Authorizing Provider  cephALEXin (KEFLEX) 500 MG capsule Take 1 capsule (500 mg total) by mouth 3 (three) times daily for 7 days. 11/06/21 11/13/21 Yes Luna Fuse, MD  albuterol (VENTOLIN HFA) 108 (90 Base) MCG/ACT inhaler USE 1 PUFF 4 TIMES A DAY AS NEEDED 03/06/16   [provider]  albuterol (VENTOLIN HFA) 108 (90 Base) MCG/ACT inhaler 1 puff 4 (four) times daily. 08/08/21   [provider]  allopurinol (ZYLOPRIM) 100 MG tablet Take 1 tablet by mouth daily. 06/14/21   [provider]  allopurinol (ZYLOPRIM) 100 MG tablet Take 100 mg by mouth daily. 10/07/21   [provider]  amLODipine (NORVASC) 10 MG tablet Take 1 tablet (10 mg total) by mouth daily. 10/07/21   Autry-Lott, Naaman Plummer, DO  amLODipine (NORVASC) 10 MG tablet Take by mouth.    [provider]  aspirin 81 MG EC tablet Take 81 mg by mouth daily.    [provider]  atorvastatin (LIPITOR) 20 MG tablet Take by mouth. 03/04/16   [provider]  atorvastatin (LIPITOR) 40 MG tablet Take 1 tablet (40 mg total) by mouth daily. 10/07/21   Autry-Lott, Naaman Plummer, DO  azelastine (ASTELIN) 0.1 % nasal spray Place into the nose.    [provider]  B-D INS SYR ULTRAFINE 1CC/30G 30G X 1/2" 1 ML MISC AS DIRECTED WITH 70/30 INSULIN MISC AS DIRECTED 08/19/21   [provider]  Blood Glucose Monitoring Suppl  (FIFTY50 GLUCOSE METER 2.0) w/Device KIT  04/01/09   [provider]  budesonide (RHINOCORT AQUA) 32 MCG/ACT nasal spray  07/18/09   [provider]  clopidogrel (PLAVIX) 75 MG tablet Take 1 tablet (75 mg total) by mouth daily. 10/07/21   Autry-Lott, Naaman Plummer, DO  diclofenac (VOLTAREN) 75 MG EC tablet  11/10/15   [provider]  ergocalciferol (VITAMIN D2) 1.25 MG (50000 UT) capsule Take by mouth.    [provider]  ferrous sulfate 325 (65 FE) MG tablet Take by mouth.    [provider]  fluconazole (DIFLUCAN) 200 MG tablet Take by mouth. 03/15/16   [provider]  fluticasone (FLONASE) 50 MCG/ACT nasal spray Place 2 sprays into both nostrils daily. 06/03/21   [provider]  furosemide (LASIX) 20 MG tablet  03/26/19   [provider]  gabapentin (NEURONTIN) 100 MG capsule Take 100 mg by mouth 2 (two) times daily. 08/19/21   [provider]  gabapentin (NEURONTIN) 100 MG capsule Take 1 capsule by mouth 2 (two) times daily. 12/10/19   [provider]  gabapentin (NEURONTIN) 300 MG capsule  08/20/15   [provider]  glucose blood (FREESTYLE LITE) test strip  04/01/09   [provider]  glucose blood (ONETOUCH ULTRA) test strip  04/17/19   [provider]  HUMULIN 70/30 (70-30) 100 UNIT/ML injection SMARTSIG:40 Unit(s) SUB-Q Twice Daily 07/30/21  [provider]  HYDROcodone-acetaminophen (NORCO/VICODIN) 5-325 MG tablet Take 1 tablet by mouth 4 (four) times daily as needed. 05/18/21   [provider]  insulin aspart protamine - aspart (NOVOLOG 70/30 MIX) (70-30) 100 UNIT/ML FlexPen Inject 30 Units into the skin See admin instructions. Inject 30 units into the skin in the morning, and inject 30 units under the skin at bedtime    [provider]  insulin NPH-regular Human (HUMULIN 70/30) (70-30) 100 UNIT/ML injection Inject into the skin.    [provider]   insulin NPH-regular Human (HUMULIN 70/30) (70-30) 100 UNIT/ML injection Inject into the skin. 11/22/17   [provider]  Insulin Syringe-Needle U-100 (B-D INS SYR ULTRAFINE 1CC/30G) 30G X 1/2" 1 ML MISC USE AS DIRECTED WITH 70/30 INSULIN. 08/29/15   [provider]  Insulin Syringe-Needle U-100 (GLOBAL INJECT EASE INSULIN SYR) 30G X 1/2" 1 ML MISC  08/29/15   [provider]  Insulin Syringe-Needle U-100 (INSULIN SYRINGE 1CC/31GX5/16") 31G X 5/16" 1 ML MISC  01/18/11   [provider]  lisinopril (ZESTRIL) 40 MG tablet Take by mouth.    [provider]  meloxicam (MOBIC) 7.5 MG tablet Take by mouth.    [provider]  metoprolol tartrate (LOPRESSOR) 25 MG tablet  03/26/19   [provider]  metoprolol tartrate (LOPRESSOR) 25 MG tablet Take 25 mg by mouth 2 (two) times daily. 07/10/21   [provider]  Misc Natural Products (GLUCOSAMINE-CHONDROITIN PLUS PO)  06/28/11   [provider]  montelukast (SINGULAIR) 10 MG tablet Take by mouth.    [provider]  montelukast (SINGULAIR) 10 MG tablet Take 10 mg by mouth daily. 07/13/21   [provider]  mupirocin ointment (BACTROBAN) 2 % APPLY TO AFFECTED AREA TWICE A DAY 02/18/19   [provider]  Vitamin D, Ergocalciferol, (DRISDOL) 1.25 MG (50000 UNIT) CAPS capsule Take 50,000 Units by mouth once a week. 08/12/21   [provider]      Allergies    Codeine, Shrimp extract allergy skin test, Wound dressing adhesive, Nortriptyline, and Chocolate flavor    Review of Systems   Review of Systems  Constitutional:  Negative for fever.  HENT:  Negative for ear pain.   Eyes:  Negative for pain.  Respiratory:  Negative for cough.   Cardiovascular:  Negative for chest pain.  Gastrointestinal:  Negative for abdominal pain.  Genitourinary:  Negative for flank pain.  Musculoskeletal:  Negative for back pain.  Skin:  Negative for rash.   Neurological:  Negative for headaches.   Physical Exam Updated Vital Signs BP 136/67 (BP Location: Left Arm)    Pulse 86    Temp 98.3 F (36.8 C) (Oral)    Resp 18    Ht '5\' 4"'  (1.626 m)    Wt 91.8 kg    SpO2 98%    BMI 34.74 kg/m  Physical Exam Constitutional:      General: She is not in acute distress.    Appearance: Normal appearance.  HENT:     Head: Normocephalic.     Nose: Nose normal.  Eyes:     Extraocular Movements: Extraocular movements intact.  Cardiovascular:     Rate and Rhythm: Normal rate.  Pulmonary:     Effort: Pulmonary effort is normal.  Abdominal:     Tenderness: There is no abdominal tenderness. There is no right CVA tenderness, left CVA tenderness, guarding or rebound.  Musculoskeletal:  General: Normal range of motion.     Cervical back: Normal range of motion.  Neurological:     General: No focal deficit present.     Mental Status: She is alert. Mental status is at baseline.    ED Results / Procedures / Treatments   Labs (all labs ordered are listed, but only abnormal results are displayed) Labs Reviewed  URINALYSIS, ROUTINE W REFLEX MICROSCOPIC - Abnormal; Notable for the following components:      Result Value   Color, Urine BROWN (*)    APPearance TURBID (*)    Hgb urine dipstick LARGE (*)    Protein, ur >300 (*)    Nitrite POSITIVE (*)    Leukocytes,Ua LARGE (*)    All other components within normal limits  URINALYSIS, MICROSCOPIC (REFLEX) - Abnormal; Notable for the following components:   Bacteria, UA MANY (*)    All other components within normal limits  URINE CULTURE    EKG None  Radiology No results found.  Procedures Procedures    Medications Ordered in ED Medications  cefTRIAXone (ROCEPHIN) injection 1 g (has no administration in time range)    ED Course/ Medical Decision Making/ A&P                           Medical Decision Making Amount and/or Complexity of Data Reviewed Labs:  ordered.  Risk Prescription drug management.   Chart review shows recent office visit was 04/13/2021 for astigmatism with her ophthalmologist.  Clinically suspect urinary tract infection.  Urinalysis sent positive WBCs and leukocytes and nitrates.  Given Rocephin here in the ER IM, given a prescription of Keflex to go home with.  I did consider doing additional blood work, however vital signs remained stable patient has no flank pain no fevers reported I doubt pyelonephritis.  Will recommend close outpatient follow-up with primary care doctor or urologist within the week.  Recommending immediate return for pain or fevers or any additional concerns.        Final Clinical Impression(s) / ED Diagnoses Final diagnoses:  Urinary tract infection with hematuria, site unspecified    Rx / DC Orders ED Discharge Orders          Ordered    cephALEXin (KEFLEX) 500 MG capsule  3 times daily        11/06/21 1923              Luna Fuse, MD 11/06/21 1925

## 2021-11-08 ENCOUNTER — Other Ambulatory Visit: Payer: Self-pay | Admitting: *Deleted

## 2021-11-08 NOTE — Patient Outreach (Signed)
[facility]) Care Management Telephonic RN Care Manager Note   11/08/2021 Name:  Tamara Herring MRN:  [id number] DOB:  1946-02-15  Summary: Returned a call to daughter Tamara Herring after receiving a message to return a call Tamara Herring updated RN CM patient is doing better with constipation- having better stools Patient continues with foley and has urology follow appointment on 11/16/21   Today Tamara Herring with concern about patient not meeting medicaid requirements She received a call from a medicaid staff after leaving a message for RN CM to answer some questions Case closure   Recommendations/Changes made from today's visit: Assessed for improvements with constipation. Foley usage & follow up outreach from urologist Answered questions as best as RN CM was able to related to medicaid  Encouraged daughter to outreach to [employer], appeal and provide updated billing, budgeting information related to patient for medicaid application Case closure    Subjective: Tamara Herring is an 76 y.o. year old female who is a primary patient of Karie Mainland, [place]. The care management team was consulted for assistance with care management and/or care coordination needs.    Telephonic RN Care Manager completed Telephone Visit today.   Objective:  Medications Reviewed Today     Reviewed by Barbaraann Faster, RN (Registered Nurse) on 10/30/21 at 12  Med List Status: <None>   Medication Order Taking? Sig Documenting Provider Last Dose Status Informant  albuterol (VENTOLIN HFA) 108 (90 Base) MCG/ACT inhaler [id number]  USE 1 PUFF 4 TIMES A DAY AS NEEDED [provider]  Active   albuterol (VENTOLIN HFA) 108 (90 Base) MCG/ACT inhaler [id number]  1 puff 4 (four) times daily. [provider]  Active   allopurinol (ZYLOPRIM) 100 MG tablet [id number]  Take 1 tablet by mouth daily. [provider]  Active   allopurinol (ZYLOPRIM) 100 MG tablet [id number]  Take 100 mg by  mouth daily. [provider]  Active   amLODipine (NORVASC) 10 MG tablet [id number]  Take 1 tablet (10 mg total) by mouth daily. Autry-Lott, Simone, DO  Active   amLODipine (NORVASC) 10 MG tablet [id number]  Take by mouth. [provider]  Active   aspirin 81 MG EC tablet [id number]  Take 81 mg by mouth daily. [provider]  Active Self  atorvastatin (LIPITOR) 20 MG tablet [id number]  Take by mouth. [provider]  Active   atorvastatin (LIPITOR) 40 MG tablet [id number]  Take 1 tablet (40 mg total) by mouth daily. Autry-Lott, Naaman Plummer, DO  Active   azelastine (ASTELIN) 0.1 % nasal spray [id number]  Place into the nose. [provider]  Active   B-D INS SYR ULTRAFINE 1CC/30G 30G X 1/2" 1 ML MISC [id number]  AS DIRECTED WITH 70/30 INSULIN MISC AS DIRECTED [provider]  Active   Blood Glucose Monitoring Suppl (FIFTY50 GLUCOSE METER 2.0) w/Device KIT [id number]   [provider]  Active   budesonide (RHINOCORT AQUA) 32 MCG/ACT nasal spray [id number]   [provider]  Active   clopidogrel (PLAVIX) 75 MG tablet [id number]  Take 1 tablet (75 mg total) by mouth daily. Autry-Lott, Naaman Plummer, DO  Active   diclofenac (VOLTAREN) 75 MG EC tablet [id number]   [provider]  Active   doxycycline (VIBRA-TABS) 100 MG tablet [id number]  2 mg. [provider]  Active   ergocalciferol (VITAMIN D2) 1.25 MG (50000 UT) capsule [id number]  Take by mouth. [provider]  Active   ferrous sulfate 325 (65 FE) MG tablet [id number]  Take by mouth. [provider]  Active   fluconazole (DIFLUCAN) 200 MG tablet [id number]  Take by mouth. [provider]  Active   fluticasone (FLONASE) 50 MCG/ACT nasal spray [id number]  Place 2 sprays into both nostrils daily. [provider]  Active   furosemide (LASIX) 20 MG tablet [id number]   [provider]  Active   gabapentin (NEURONTIN) 100 MG capsule [id number]  Take  100 mg by mouth 2 (two) times daily. [provider]  Active Self  gabapentin (NEURONTIN) 100 MG capsule [id number]  Take 1 capsule by mouth 2 (two) times daily. [provider]  Active   gabapentin (NEURONTIN) 300 MG capsule [id number]   [provider]  Active   glucose blood (FREESTYLE LITE) test strip [id number]   [provider]  Active   glucose blood (ONETOUCH ULTRA) test strip [id number]   [provider]  Active   HUMULIN 70/30 (70-30) 100 UNIT/ML injection [id number]  SMARTSIG:40 Unit(s) SUB-Q Twice Daily [provider]  Active   HYDROcodone-acetaminophen (NORCO/VICODIN) 5-325 MG tablet [id number]  Take 1 tablet by mouth 4 (four) times daily as needed. [provider]  Active   insulin aspart protamine - aspart (NOVOLOG 70/30 MIX) (70-30) 100 UNIT/ML FlexPen [id number]  Inject 30 Units into the skin See admin instructions. Inject 30 units into the skin in the morning, and inject 30 units under the skin at bedtime [provider]  Active Self  insulin NPH-regular Human (HUMULIN 70/30) (70-30) 100 UNIT/ML injection [id number]  Inject into the skin. [provider]  Active   insulin NPH-regular Human (HUMULIN 70/30) (70-30) 100 UNIT/ML injection [id number]  Inject into the skin. [provider]  Active   Insulin Syringe-Needle U-100 (B-D INS SYR ULTRAFINE 1CC/30G) 30G X 1/2" 1 ML MISC [id number]  USE AS DIRECTED WITH 70/30 INSULIN. [provider]  Active   Insulin Syringe-Needle U-100 (GLOBAL INJECT EASE INSULIN SYR) 30G X 1/2" 1 ML MISC [id number]   [provider]  Active   Insulin Syringe-Needle U-100 (INSULIN SYRINGE 1CC/31GX5/16") 31G X 5/16" 1 ML MISC [id number]   [provider]  Active   lisinopril (ZESTRIL) 40 MG tablet [id number]  Take by mouth. [provider]  Active   meloxicam (MOBIC) 7.5 MG tablet [id number]  Take by mouth. [provider]  Active    metoprolol tartrate (LOPRESSOR) 25 MG tablet [id number]   [provider]  Active   metoprolol tartrate (LOPRESSOR) 25 MG tablet [id number]  Take 25 mg by mouth 2 (two) times daily. [provider]  Active   Misc Natural Products (GLUCOSAMINE-CHONDROITIN PLUS PO) [id number]   [provider]  Active   montelukast (SINGULAIR) 10 MG tablet [id number]  Take by mouth. [provider]  Active   montelukast (SINGULAIR) 10 MG tablet [id number]  Take 10 mg by mouth daily. [provider]  Active   mupirocin ointment (BACTROBAN) 2 % [id number]  APPLY TO AFFECTED AREA TWICE A DAY [provider]  Active   nitrofurantoin, macrocrystal-monohydrate, (MACROBID) 100 MG capsule [id number]   [provider]  Active   Vitamin D, Ergocalciferol, (DRISDOL) 1.25 MG (50000 UNIT) CAPS capsule [id number]  Take 50,000 Units by mouth once a week. [provider]  Active              SDOH:  (Social Determinants of Health) assessments and interventions performed:    Care Plan  Review of patient past medical history, allergies, medications, health status, including  review of consultants reports, laboratory and other test data, was performed as part of comprehensive evaluation for care management services.   There are no care plans that you recently modified to display for this patient.    Plan: No further follow up required: no further needs, eligibility for EMMI services concluded  [place] L. Lavina Hamman, RN, BSN, [place] Coordinator Office number [phone] Main [facility] number [phone] Fax number [phone]

## 2021-11-09 LAB — URINE CULTURE: Culture: >=100000 — AB

## 2021-11-10 ENCOUNTER — Telehealth: Payer: Self-pay | Admitting: *Deleted

## 2021-11-10 NOTE — Progress Notes (Signed)
ED Antimicrobial Stewardship Positive Culture Follow Up   Tamara Herring is an 76 y.o. female who presented to [facility] on 11/06/2021 with a chief complaint of No chief complaint on file.   Recent Results (from the past 720 hour(s))  Urine Culture     Status: Abnormal   Collection Time: 11/06/21  6:15 PM   Specimen: Urine, Random  Result Value Ref Range Status   Specimen Description   Final    URINE, RANDOM Performed at [facility], [address] [place], [place] [address]    Special Requests   Final    NONE Performed at [facility], [address] [place], [place] [address]    Culture >=100,000 COLONIES/mL SERRATIA MARCESCENS (A)  Final   Report Status 11/09/2021 FINAL  Final   Organism ID, Bacteria SERRATIA MARCESCENS (A)  Final      Susceptibility   Serratia marcescens - MIC*    CEFAZOLIN >=64 RESISTANT Resistant     CEFEPIME <=0.12 SENSITIVE Sensitive     CEFTRIAXONE <=0.25 SENSITIVE Sensitive     CIPROFLOXACIN <=0.25 SENSITIVE Sensitive     GENTAMICIN <=1 SENSITIVE Sensitive     NITROFURANTOIN 128 RESISTANT Resistant     TRIMETH/SULFA <=20 SENSITIVE Sensitive     * >=100,000 COLONIES/mL SERRATIA MARCESCENS    [x]  Treated with Keflex, organism resistant to prescribed antimicrobial  New antibiotic prescription: [place]  ED Provider: Alyse Low, PA-C   Bertis Ruddy 11/10/2021, 8:18 AM Clinical Pharmacist Monday - Friday phone -  [phone] Saturday - Sunday phone - [phone]

## 2021-11-10 NOTE — Telephone Encounter (Signed)
Post ED Visit - Positive Culture Follow-up: Successful Patient Follow-Up  Culture assessed and recommendations reviewed by:  []  , Pharm.D. []  Enzo Bi, Pharm.D., BCPS AQ-ID []  , Pharm.D., BCPS []  Celedonio Miyamoto, .D., BCPS []  [place], .D., BCPS, AAHIVP []  Georgina Pillion, Pharm.D., BCPS, AAHIVP []  [address], PharmD, BCPS []  , PharmD, BCPS []  [place], PharmD, BCPS []  [address], PharmD  Positive urine culture  []  Patient discharged without antimicrobial prescription and treatment is now indicated [x]  Organism is resistant to prescribed ED discharge antimicrobial []  Patient with positive blood cultures  Changes discussed with ED provider: , PA New antibiotic prescription Omnicef 300mg  BID x 5 days Called to [facility], [place], [place] [phone]  Contacted patient, date 11/10/2021, time 0830   11/10/2021, 8:33 AM

## 2023-01-15 ENCOUNTER — Other Ambulatory Visit (HOSPITAL_COMMUNITY): Payer: Self-pay

## 2023-11-05 ENCOUNTER — Other Ambulatory Visit (HOSPITAL_BASED_OUTPATIENT_CLINIC_OR_DEPARTMENT_OTHER): Payer: Self-pay

## 2023-11-05 ENCOUNTER — Encounter (HOSPITAL_BASED_OUTPATIENT_CLINIC_OR_DEPARTMENT_OTHER): Payer: Self-pay | Admitting: Emergency Medicine

## 2023-11-05 ENCOUNTER — Emergency Department (HOSPITAL_BASED_OUTPATIENT_CLINIC_OR_DEPARTMENT_OTHER)
Admission: EM | Admit: 2023-11-05 | Discharge: 2023-11-05 | Disposition: A | Discharge status: Home / Self Care | Payer: 59 | Attending: Emergency Medicine | Admitting: Emergency Medicine

## 2023-11-05 ENCOUNTER — Other Ambulatory Visit: Payer: Self-pay

## 2023-11-05 DIAGNOSIS — E119 Type 2 diabetes mellitus without complications: Secondary | ICD-10-CM | POA: Insufficient documentation

## 2023-11-05 DIAGNOSIS — R21 Rash and other nonspecific skin eruption: Secondary | ICD-10-CM | POA: Diagnosis present

## 2023-11-05 DIAGNOSIS — I10 Essential (primary) hypertension: Secondary | ICD-10-CM | POA: Diagnosis not present

## 2023-11-05 DIAGNOSIS — Z79899 Other long term (current) drug therapy: Secondary | ICD-10-CM | POA: Diagnosis not present

## 2023-11-05 DIAGNOSIS — Z7982 Long term (current) use of aspirin: Secondary | ICD-10-CM | POA: Insufficient documentation

## 2023-11-05 DIAGNOSIS — B372 Candidiasis of skin and nail: Secondary | ICD-10-CM | POA: Insufficient documentation

## 2023-11-05 DIAGNOSIS — Z794 Long term (current) use of insulin: Secondary | ICD-10-CM | POA: Insufficient documentation

## 2023-11-05 HISTORY — DX: Cerebral infarction, unspecified: I63.9

## 2023-11-05 MED ORDER — NYSTATIN 100000 UNIT/GM EX POWD
1.0000 | Freq: Three times a day (TID) | CUTANEOUS | 0 refills | Status: AC
  Filled 2023-11-05: qty 15, 5d supply, fill #0

## 2023-11-05 MED ORDER — CEPHALEXIN 500 MG PO CAPS
500.0000 mg | ORAL_CAPSULE | Freq: Four times a day (QID) | ORAL | 0 refills | Status: AC
  Filled 2023-11-05: qty 20, 5d supply, fill #0

## 2023-11-05 MED ORDER — AMLODIPINE BESYLATE 5 MG PO TABS
10.0000 mg | ORAL_TABLET | Freq: Once | ORAL | Status: AC
  Administered 2023-11-05: 10 mg via ORAL
  Filled 2023-11-05: qty 2

## 2023-11-05 NOTE — ED Provider Notes (Signed)
 [facility] Provider Note   CSN: [id number] Arrival date & time: 11/05/23  1020     History  Chief Complaint  Patient presents with   Rash    Tamara Herring is a 78 y.o. female.  With history of IDDM 2, hypertension presenting to the ED for evaluation of a rash.  Unclear when the rash began, however she started to feel some pain yesterday.  She shoulder daughter who cleaned the area with soapy water and noticed a foul smell.  Rashes to the right inframammary fold and right inguinal area.  No fevers or chills.  No nausea or vomiting.  No abdominal pain.   Rash      Home Medications Prior to Admission medications   Medication Sig Start Date End Date Taking? Authorizing Provider  cephALEXin  (KEFLEX ) 500 MG capsule Take 1 capsule (500 mg total) by mouth 4 (four) times daily. 11/05/23  Yes Jermon Chalfant, Marsa HERO, PA-C  nystatin  (MYCOSTATIN /NYSTOP ) powder Apply 1 Application topically 3 (three) times daily. 11/05/23  Yes Oaklyn Jakubek, Marsa HERO, PA-C  albuterol (VENTOLIN HFA) 108 (90 Base) MCG/ACT inhaler USE 1 PUFF 4 TIMES A DAY AS NEEDED 03/06/16   [provider]  albuterol (VENTOLIN HFA) 108 (90 Base) MCG/ACT inhaler 1 puff 4 (four) times daily. 08/08/21   [provider]  allopurinol (ZYLOPRIM) 100 MG tablet Take 1 tablet by mouth daily. 06/14/21   [provider]  allopurinol (ZYLOPRIM) 100 MG tablet Take 100 mg by mouth daily. 10/07/21   [provider]  amLODipine  (NORVASC ) 10 MG tablet Take 1 tablet (10 mg total) by mouth daily. 10/07/21   Autry-Lott, Rojean, DO  amLODipine  (NORVASC ) 10 MG tablet Take by mouth.    [provider]  aspirin  81 MG EC tablet Take 81 mg by mouth daily.    [provider]  atorvastatin  (LIPITOR) 20 MG tablet Take by mouth. 03/04/16   [provider]  atorvastatin  (LIPITOR) 40 MG tablet Take 1 tablet (40 mg total) by mouth daily. 10/07/21   Autry-Lott, Rojean, DO   azelastine (ASTELIN) 0.1 % nasal spray Place into the nose.    [provider]  B-D INS SYR ULTRAFINE 1CC/30G 30G X 1/2 1 ML MISC AS DIRECTED WITH 70/30 INSULIN  MISC AS DIRECTED 08/19/21   [provider]  Blood Glucose Monitoring Suppl (FIFTY50 GLUCOSE METER 2.0) w/Device KIT  04/01/09   [provider]  budesonide (RHINOCORT AQUA) 32 MCG/ACT nasal spray  07/18/09   [provider]  clopidogrel  (PLAVIX ) 75 MG tablet Take 1 tablet (75 mg total) by mouth daily. 10/07/21   Autry-Lott, Rojean, DO  diclofenac (VOLTAREN) 75 MG EC tablet  11/10/15   [provider]  ergocalciferol (VITAMIN D2) 1.25 MG (50000 UT) capsule Take by mouth.    [provider]  ferrous sulfate 325 (65 FE) MG tablet Take by mouth.    [provider]  fluconazole (DIFLUCAN) 200 MG tablet Take by mouth. 03/15/16   [provider]  fluticasone (FLONASE) 50 MCG/ACT nasal spray Place 2 sprays into both nostrils daily. 06/03/21   [provider]  furosemide (LASIX) 20 MG tablet  03/26/19   [provider]  gabapentin (NEURONTIN) 100 MG capsule Take 100 mg by mouth 2 (two) times daily. 08/19/21   [provider]  gabapentin (NEURONTIN) 100 MG capsule Take 1 capsule by mouth 2 (two) times daily. 12/10/19   [provider]  gabapentin (NEURONTIN) 300 MG capsule  08/20/15   [provider]  glucose blood (FREESTYLE LITE) test strip  04/01/09   [provider]  glucose blood (ONETOUCH ULTRA) test strip  04/17/19   [provider]  HUMULIN 70/30 (70-30) 100 UNIT/ML injection SMARTSIG:40 Unit(s) SUB-Q Twice Daily 07/30/21   [provider]  HYDROcodone-acetaminophen  (NORCO/VICODIN) 5-325 MG tablet Take 1 tablet by mouth 4 (four) times daily as needed. 05/18/21   [provider]  insulin  aspart protamine - aspart (NOVOLOG  70/30 MIX) (70-30) 100 UNIT/ML FlexPen Inject 30 Units into the skin See admin  instructions. Inject 30 units into the skin in the morning, and inject 30 units under the skin at bedtime    [provider]  insulin  NPH-regular Human (HUMULIN 70/30) (70-30) 100 UNIT/ML injection Inject into the skin.    [provider]  insulin  NPH-regular Human (HUMULIN 70/30) (70-30) 100 UNIT/ML injection Inject into the skin. 11/22/17   [provider]  Insulin  Syringe-Needle U-100 (B-D INS SYR ULTRAFINE 1CC/30G) 30G X 1/2 1 ML MISC USE AS DIRECTED WITH 70/30 INSULIN . 08/29/15   [provider]  Insulin  Syringe-Needle U-100 (GLOBAL INJECT EASE INSULIN  SYR) 30G X 1/2 1 ML MISC  08/29/15   [provider]  Insulin  Syringe-Needle U-100 (INSULIN  SYRINGE 1CC/31GX5/16) 31G X 5/16 1 ML MISC  01/18/11   [provider]  lisinopril  (ZESTRIL ) 40 MG tablet Take by mouth.    [provider]  meloxicam (MOBIC) 7.5 MG tablet Take by mouth.    [provider]  metoprolol  tartrate (LOPRESSOR ) 25 MG tablet  03/26/19   [provider]  metoprolol  tartrate (LOPRESSOR ) 25 MG tablet Take 25 mg by mouth 2 (two) times daily. 07/10/21   [provider]  Misc Natural Products (GLUCOSAMINE-CHONDROITIN PLUS PO)  06/28/11   [provider]  montelukast (SINGULAIR) 10 MG tablet Take by mouth.    [provider]  montelukast (SINGULAIR) 10 MG tablet Take 10 mg by mouth daily. 07/13/21   [provider]  mupirocin ointment (BACTROBAN) 2 % APPLY TO AFFECTED AREA TWICE A DAY 02/18/19   [provider]  Vitamin D, Ergocalciferol, (DRISDOL) 1.25 MG (50000 UNIT) CAPS capsule Take 50,000 Units by mouth once a week. 08/12/21   [provider]      Allergies    Codeine, Shrimp extract, Wound dressing adhesive, Nortriptyline, and Chocolate flavoring agent (non-screening)    Review of Systems   Review of Systems  Skin:  Positive for rash.  All other systems reviewed and are  negative.   Physical Exam Updated Vital Signs BP (!) 189/97 (BP Location: Right Arm)   Pulse (!) 57   Temp 97.8 F (36.6 C) (Oral)   Resp 20   Ht 5' 6 (1.676 m)   Wt 61.2 kg   SpO2 98%   BMI 21.79 kg/m  Physical Exam Vitals and nursing note reviewed.  Constitutional:      General: She is not in acute distress.    Appearance: Normal appearance. She is normal weight. She is not ill-appearing.  HENT:     Head: Normocephalic and atraumatic.  Pulmonary:     Effort: Pulmonary effort is normal. No respiratory distress.  Abdominal:     General: Abdomen is flat.  Musculoskeletal:        General: Normal range of motion.     Cervical back: Neck supple.  Skin:    General: Skin is warm and dry.     Comments: Shiny erythematous skin and right  inframammary fold and inguinal fold with satellite lesions.  No warmth.  No purulent drainage.  No induration.  No open wounds.  Neurological:     Mental Status: She is alert and oriented to person, place, and time.  Psychiatric:        Mood and Affect: Mood normal.        Behavior: Behavior normal.     ED Results / Procedures / Treatments   Labs (all labs ordered are listed, but only abnormal results are displayed) Labs Reviewed - No data to display  EKG None  Radiology No results found.  Procedures Procedures    Medications Ordered in ED Medications  amLODipine  (NORVASC ) tablet 10 mg (has no administration in time range)    ED Course/ Medical Decision Making/ A&P                                 Medical Decision Making Risk Prescription drug management.  This patient presents to the ED for concern of rash, this involves an extensive number of treatment options, and is a complaint that carries with it a high risk of complications and morbidity.  The differential diagnosis includes cutaneous candidiasis, cellulitis  Additional history obtained from: Nursing notes from this visit. Family daughter at bedside provides  portion of history  Afebrile, significantly hypertensive but otherwise hemodynamically stable.  78 year old female presenting to the ED for evaluation of rash.  She is unsure when the rash actually began but noticed symptoms of a burning sensation yesterday.  On exam, this does appear consistent with cutaneous candidiasis.  Patient does have a history of diabetes.  Will treat with nystatin  and will also treat nonpurulent cellulitis with Keflex . She is otherwise asymptomatic from her hypertension. On chart review, it appears she is unsure of her home antihypertensive regimen and has uncontrolled blood pressure readings. She was strongly encouraged to follow up with her PCP regarding these readings. She was given return precautions. Stable at discharge.  At this time there does not appear to be any evidence of an acute emergency medical condition and the patient appears stable for discharge with appropriate outpatient follow up. Diagnosis was discussed with patient who verbalizes understanding of care plan and is agreeable to discharge. I have discussed return precautions with patient and daughter who verbalizes understanding. Patient encouraged to follow-up with their PCP within 1 week. All questions answered.  Patient's case discussed with Dr. Ellouise who agrees with plan to discharge with follow-up.   Note: Portions of this report may have been transcribed using voice recognition software. Every effort was made to ensure accuracy; however, inadvertent computerized transcription errors may still be present.         Final Clinical Impression(s) / ED Diagnoses Final diagnoses:  Cutaneous candidiasis  Uncontrolled hypertension    Rx / DC Orders ED Discharge Orders          Ordered    nystatin  (MYCOSTATIN /NYSTOP ) powder  3 times daily        11/05/23 1128    cephALEXin  (KEFLEX ) 500 MG capsule  4 times daily        11/05/23 [address] PA-C 11/05/23  1154    Ellouise Fine K, DO 11/05/23 1521

## 2023-11-05 NOTE — Discharge Instructions (Addendum)
 You have been seen today for your complaint of skin infection.  This is likely yeast in nature. Your discharge medications include nystatin  powder.  Apply 3 times a day.  Keep the area very dry throughout the day. Keflex . This is an antibiotic. You should take it as prescribed. You should take it for the entire duration of the prescription. This may cause an upset stomach. This is normal. You may take this with food. You may also eat yogurt to prevent diarrhea.  Follow up with: your PCP in one week Please seek immediate medical care if you develop any of the following symptoms: New or worsening symptoms such as fevers, nausea or vomiting At this time there does not appear to be the presence of an emergent medical condition, however there is always the potential for conditions to change. Please read and follow the below instructions.  Do not take your medicine if  develop an itchy rash, swelling in your mouth or lips, or difficulty breathing; call 911 and seek immediate emergency medical attention if this occurs.  You may review your lab tests and imaging results in their entirety on your MyChart account.  Please discuss all results of fully with your primary care provider and other specialist at your follow-up visit.  Note: Portions of this text may have been transcribed using voice recognition software. Every effort was made to ensure accuracy; however, inadvertent computerized transcription errors may still be present.

## 2023-11-05 NOTE — ED Triage Notes (Signed)
States has had a rash to her groin area and under her breast x 1 week.
